# Patient Record
Sex: Female | Born: 1950
Health system: Southern US, Community
[De-identification: ages and names within clinical notes are randomized; demographics above are authoritative.]

## PROBLEM LIST (undated history)

## (undated) DIAGNOSIS — I2510 Atherosclerotic heart disease of native coronary artery without angina pectoris: Secondary | ICD-10-CM

## (undated) DIAGNOSIS — I213 ST elevation (STEMI) myocardial infarction of unspecified site: Secondary | ICD-10-CM

## (undated) DIAGNOSIS — I10 Essential (primary) hypertension: Secondary | ICD-10-CM

## (undated) HISTORY — DX: Atherosclerotic heart disease of native coronary artery without angina pectoris: I25.10

## (undated) HISTORY — PX: CORONARY ARTERY BYPASS GRAFT: SHX141

## (undated) HISTORY — DX: Essential (primary) hypertension: I10

## (undated) HISTORY — DX: ST elevation (STEMI) myocardial infarction of unspecified site: I21.3

## (undated) HISTORY — PX: CARDIAC CATHETERIZATION: SHX172

---

## 2005-01-21 ENCOUNTER — Ambulatory Visit: Payer: Self-pay | Admitting: Orthopedic Surgery

## 2005-01-27 ENCOUNTER — Ambulatory Visit: Payer: Self-pay | Admitting: Orthopedic Surgery

## 2005-02-17 ENCOUNTER — Ambulatory Visit: Payer: Self-pay | Admitting: Orthopedic Surgery

## 2008-10-02 ENCOUNTER — Ambulatory Visit (HOSPITAL_COMMUNITY): Admission: RE | Admit: 2008-10-02 | Discharge: 2008-10-02 | Payer: Self-pay | Admitting: Pulmonary Disease

## 2008-10-16 ENCOUNTER — Ambulatory Visit (HOSPITAL_COMMUNITY): Admission: RE | Admit: 2008-10-16 | Discharge: 2008-10-16 | Payer: Self-pay | Admitting: Pulmonary Disease

## 2008-10-28 ENCOUNTER — Encounter: Payer: Self-pay | Admitting: Gastroenterology

## 2008-10-28 ENCOUNTER — Ambulatory Visit (HOSPITAL_COMMUNITY): Admission: RE | Admit: 2008-10-28 | Discharge: 2008-10-28 | Payer: Self-pay | Admitting: Gastroenterology

## 2008-10-28 ENCOUNTER — Ambulatory Visit: Payer: Self-pay | Admitting: Gastroenterology

## 2010-08-28 ENCOUNTER — Ambulatory Visit (HOSPITAL_COMMUNITY): Admission: RE | Admit: 2010-08-28 | Discharge: 2010-08-28 | Payer: Self-pay | Admitting: Pulmonary Disease

## 2010-09-03 ENCOUNTER — Ambulatory Visit (HOSPITAL_COMMUNITY): Admission: RE | Admit: 2010-09-03 | Discharge: 2010-09-03 | Payer: Self-pay | Admitting: Pulmonary Disease

## 2011-04-20 NOTE — Op Note (Signed)
NAME:  MIOSOTIS, WETSEL              ACCOUNT NO.:  [id number]   MEDICAL RECORD NO.:  [id number]          PATIENT TYPE:  AMB   LOCATION:  DAY                           FACILITY:  [facility]   PHYSICIAN:  Kassie Mends, M.D.      DATE OF BIRTH:  06/26/1951   DATE OF PROCEDURE:  DATE OF DISCHARGE:                               OPERATIVE REPORT   REFERRING PHYSICIAN:  Edward L. Juanetta Gosling, MD   PROCEDURE:  Colonoscopy with snare cautery polypectomy and tattoo of the  base.   INDICATION FOR EXAM:  Ms. Waner is a 60 year old female who presents  for average risk colon cancer screening.   FINDINGS:  1. A 6-mm slightly pedunculated transverse colon polyp removed via      snare cautery.  The base of the lesion was tattooed with 2 mL of      Spot.  2. Frequent sigmoid colon diverticula.  Otherwise, no masses,      inflammatory changes, or AVMs seen.  Normal retroflexed view of the rectum.  1. Melanosis coli, diffuse.   DIAGNOSES:  1. Transverse colon polyp.  2. Mild diverticulosis.   RECOMMENDATIONS:  1. Will call Ms. Padmanabhan with the results of her biopsies.  2. She should follow a high-fiber diet.  She was given a handout on      high-fiber diet, polyps, diverticulosis.  3. No aspirin, NSAIDs, or anticoagulation for 7 days.   MEDICATIONS:  1. Demerol 75 mg IV.  2. Versed 3 mg IV.   PROCEDURE TECHNIQUE:  Physical exam was performed.  Informed consent was  obtained from the patient after explaining benefits, risks, and  alternatives to the procedure.  The patient was connected to the  monitor, placed in left lateral position.  Continuous oxygen was  provided by nasal cannula, IV medicine administered through an  indwelling cannula.  After administration of sedation and rectal exam,  the patient's rectum was intubated and the scope was advanced under  direct visualization to the cecum.  The scope was removed slowly by  carefully examining  the color, texture, anatomy, and integrity of  the mucosa on the way out.  The patient was recovered in endoscopy and discharged home in  satisfactory condition.   PATH:  Inflammatory polyp. TCS in 10 years.      Kassie Mends, M.D.  Electronically Signed     SM/MEDQ  D:  10/28/2008  T:  10/28/2008  Job:  [id number]   cc:   Ramon Dredge L. Juanetta Gosling, M.D.  Fax: [phone]

## 2011-04-20 NOTE — Procedures (Signed)
Paige [name], Paige [name]              ACCOUNT NO.:  [id number]   MEDICAL RECORD NO.:  [id number]          PATIENT TYPE:  OUT   LOCATION:  RESP                          FACILITY:  [facility]   PHYSICIAN:  Edward L. Juanetta Gosling, M.D.DATE OF BIRTH:  [birth year]   DATE OF PROCEDURE:  DATE OF DISCHARGE:  10/16/2008                            PULMONARY FUNCTION TEST   PULMONARY FUNCTION TEST:  1. Spirometry shows no ventilatory defect and evidence of mild airflow      obstruction at the level of the smaller airways.  2. Lung volumes are normal.  3. DLCO is mildly reduced.  4. Arterial blood gases are normal.      Edward L. Juanetta Gosling, M.D.  Electronically Signed     ELH/MEDQ  D:  10/18/2008  T:  10/18/2008  Job:  [id number]

## 2011-09-07 LAB — BLOOD GAS, ARTERIAL
Bicarbonate: 24.5 — ABNORMAL HIGH
FIO2: 21
O2 Saturation: 97.1
TCO2: 21.6

## 2015-03-21 NOTE — Progress Notes (Signed)
Patient ID: Paige [name], female   DOB: [birth year], 64 y.o.   MRN: [id number]     Cardiology Office Note   Date:  03/21/2015   ID:  Paige [name], DOB Aug 02, 1951, MRN [id number]  PCP:  No primary care provider on file.  Cardiologist:   Jenkins Rouge, MD   No chief complaint on file.     History of Present Illness: Paige [name] is a 64 y.o. female who presents for evaluation of CAD.  Had STEMI in [place] over 50 pages of records.    BMS to culprit RCA MI 01/23/15  Followed by CABG with LIMA to LAD, SVG RCA and SVG OM EF normal no valve disease  D/C with no DAT   And high dose lipitor   No preceding angina dyspnea  On day of MI did have nausea and diaphoresis with chest pain but never a problem before that day  Compliant with meds LLE venectomy sight and sternum healing well      Past Medical History  Diagnosis Date  . STEMI (ST elevation myocardial infarction)   . Coronary artery disease   . Hypertension     Past Surgical History  Procedure Laterality Date  . Cardiac catheterization    . Coronary artery bypass graft       Current Outpatient Prescriptions  Medication Sig Dispense Refill  . aspirin 81 MG tablet Take 81 mg by mouth daily.    Marland Kitchen atorvastatin (LIPITOR) 80 MG tablet Take 80 mg by mouth daily.    . Cholecalciferol (VITAMIN D3) 5000 UNITS CAPS Take 5,000 Int'l Units by mouth daily.    . famotidine (PEPCID) 20 MG tablet Take 20 mg by mouth 2 (two) times daily.    . furosemide (LASIX) 40 MG tablet Take 40 mg by mouth daily.    Marland Kitchen L-Methylfolate-B12-B6-B2 (ENFOLAST PO) Take by mouth 2 (two) times daily.    Marland Kitchen levothyroxine (SYNTHROID, LEVOTHROID) 75 MCG tablet Take 75 mcg by mouth daily before breakfast.    . metoprolol tartrate (LOPRESSOR) 25 MG tablet Take 25 mg by mouth 2 (two) times daily.    Marland Kitchen oxyCODONE-acetaminophen (PERCOCET) 10-325 MG per tablet Take 1 tablet by mouth every 4 (four) hours as needed for pain.    . traMADol (ULTRAM) 50 MG  tablet Take 50 mg by mouth every 6 (six) hours as needed.     No current facility-administered medications for this visit.    Allergies:   Review of patient's allergies indicates not on file.    Social History:  The patient     Family History:  The patient's family history includes Heart disease in her father.    ROS:  Please see the history of present illness.   Otherwise, review of systems are positive for none.   All other systems are reviewed and negative.    PHYSICAL EXAM: VS:  There were no vitals taken for this visit. , BMI There is no height or weight on file to calculate BMI. GEN: Well nourished, well developed, in no acute distress HEENT: normal Neck: no JVD, carotid bruits, or masses Cardiac:  RRR; no murmurs, rubs, or gallops,no edema  Respiratory:  clear to auscultation bilaterally, normal work of breathing GI: soft, nontender, nondistended, + BS MS: no deformity or atrophy Skin: warm and dry, no rash Neuro:  Strength and sensation are intact Psych: euthymic mood, full affect   EKG:  SR rate 50  Low voltage otherwise normal    Recent  Labs: No results found for requested labs within last 365 days.    Lipid Panel No results found for: CHOL, TRIG, HDL, CHOLHDL, VLDL, LDLCALC, LDLDIRECT    Wt Readings from Last 3 Encounters:  No data found for Wt      Other studies Reviewed: Additional studies/ records that were reviewed today include: [facility] hospital CATH and CABG reports Echo and d/c summary see HPI.    ASSESSMENT AND PLAN:  1.  CAD:  01/2015 with IMI preserved EF  Rx BMS and staged CABG.  Add plavix for 4 months with ASA  Continue other meds 2. Chol  lipitor 40 mg f/u labs Dr Hilma Favors  3. Thyroid:  Dose recently lowered f/u TSH with primary    Current medicines are reviewed at length with the patient today.  The patient does not have concerns regarding medicines.  The following changes have been made:  Lipitor 40 mg and plavix 75 mg  Labs/  tests ordered today include:  None   No orders of the defined types were placed in this encounter.     Disposition:   FU with me in 6 momths       Signed, Jenkins Rouge, MD  03/21/2015 12:19 PM    [facility] [address], [place], [place]  [address] Phone: [phone]; Fax: [phone]

## 2015-03-24 ENCOUNTER — Encounter: Payer: Self-pay | Admitting: Cardiovascular Disease

## 2015-03-24 ENCOUNTER — Ambulatory Visit (INDEPENDENT_AMBULATORY_CARE_PROVIDER_SITE_OTHER): Payer: BLUE CROSS/BLUE SHIELD | Admitting: Cardiovascular Disease

## 2015-03-24 VITALS — BP 130/78 | HR 50 | Ht 64.0 in | Wt 178.8 lb

## 2015-03-24 DIAGNOSIS — I2510 Atherosclerotic heart disease of native coronary artery without angina pectoris: Secondary | ICD-10-CM

## 2015-03-24 MED ORDER — ATORVASTATIN CALCIUM 40 MG PO TABS: 40.0000 mg | ORAL_TABLET | Freq: Every day | ORAL | Status: DC

## 2015-03-24 MED ORDER — CLOPIDOGREL BISULFATE 75 MG PO TABS: 75.0000 mg | ORAL_TABLET | Freq: Every day | ORAL | Status: DC

## 2015-03-24 NOTE — Patient Instructions (Signed)
Medication Instructions:   STOP  CRESTOR START  ATORVASTATIN   40  MG    Labwork:NONE   Testing/Procedures:NONE   Follow-Up: Your physician wants you to follow-up in:   [place] will receive a reminder letter in the mail two months in advance. If you don't receive a letter, please call our office to schedule the follow-up appointment.   Any Other Special Instructions Will Be Listed Below (If Applicable).

## 2015-05-20 ENCOUNTER — Other Ambulatory Visit: Payer: Self-pay

## 2015-05-20 MED ORDER — METOPROLOL TARTRATE 25 MG PO TABS: 25.0000 mg | ORAL_TABLET | Freq: Two times a day (BID) | ORAL | Status: DC

## 2015-05-21 DIAGNOSIS — E039 Hypothyroidism, unspecified: Secondary | ICD-10-CM | POA: Diagnosis not present

## 2015-05-21 DIAGNOSIS — R5381 Other malaise: Secondary | ICD-10-CM | POA: Diagnosis not present

## 2015-05-21 DIAGNOSIS — I10 Essential (primary) hypertension: Secondary | ICD-10-CM | POA: Diagnosis not present

## 2015-06-27 DIAGNOSIS — N342 Other urethritis: Secondary | ICD-10-CM | POA: Diagnosis not present

## 2015-06-27 DIAGNOSIS — Z1389 Encounter for screening for other disorder: Secondary | ICD-10-CM | POA: Diagnosis not present

## 2015-06-27 DIAGNOSIS — E782 Mixed hyperlipidemia: Secondary | ICD-10-CM | POA: Diagnosis not present

## 2015-06-27 DIAGNOSIS — Z6830 Body mass index (BMI) 30.0-30.9, adult: Secondary | ICD-10-CM | POA: Diagnosis not present

## 2015-06-27 DIAGNOSIS — I2510 Atherosclerotic heart disease of native coronary artery without angina pectoris: Secondary | ICD-10-CM | POA: Diagnosis not present

## 2015-06-27 DIAGNOSIS — E6609 Other obesity due to excess calories: Secondary | ICD-10-CM | POA: Diagnosis not present

## 2015-06-27 DIAGNOSIS — E039 Hypothyroidism, unspecified: Secondary | ICD-10-CM | POA: Diagnosis not present

## 2015-07-14 ENCOUNTER — Other Ambulatory Visit: Payer: Self-pay | Admitting: *Deleted

## 2015-07-14 MED ORDER — ATORVASTATIN CALCIUM 40 MG PO TABS: 40.0000 mg | ORAL_TABLET | Freq: Every day | ORAL | Status: DC

## 2015-07-14 MED ORDER — CLOPIDOGREL BISULFATE 75 MG PO TABS: 75.0000 mg | ORAL_TABLET | Freq: Every day | ORAL | Status: DC

## 2015-07-14 MED ORDER — METOPROLOL TARTRATE 25 MG PO TABS: 25.0000 mg | ORAL_TABLET | Freq: Two times a day (BID) | ORAL | Status: DC

## 2015-07-16 DIAGNOSIS — Z6830 Body mass index (BMI) 30.0-30.9, adult: Secondary | ICD-10-CM | POA: Diagnosis not present

## 2015-07-16 DIAGNOSIS — N342 Other urethritis: Secondary | ICD-10-CM | POA: Diagnosis not present

## 2015-07-16 DIAGNOSIS — R3919 Other difficulties with micturition: Secondary | ICD-10-CM | POA: Diagnosis not present

## 2015-07-16 DIAGNOSIS — Z1389 Encounter for screening for other disorder: Secondary | ICD-10-CM | POA: Diagnosis not present

## 2015-07-16 DIAGNOSIS — E6609 Other obesity due to excess calories: Secondary | ICD-10-CM | POA: Diagnosis not present

## 2015-07-21 DIAGNOSIS — E785 Hyperlipidemia, unspecified: Secondary | ICD-10-CM | POA: Diagnosis not present

## 2015-07-21 DIAGNOSIS — E039 Hypothyroidism, unspecified: Secondary | ICD-10-CM | POA: Diagnosis not present

## 2015-07-21 DIAGNOSIS — I10 Essential (primary) hypertension: Secondary | ICD-10-CM | POA: Diagnosis not present

## 2015-07-21 DIAGNOSIS — I2510 Atherosclerotic heart disease of native coronary artery without angina pectoris: Secondary | ICD-10-CM | POA: Diagnosis not present

## 2015-07-31 DIAGNOSIS — N390 Urinary tract infection, site not specified: Secondary | ICD-10-CM | POA: Diagnosis not present

## 2015-08-14 ENCOUNTER — Other Ambulatory Visit: Payer: Self-pay | Admitting: Cardiovascular Disease

## 2015-08-15 ENCOUNTER — Ambulatory Visit (INDEPENDENT_AMBULATORY_CARE_PROVIDER_SITE_OTHER): Payer: Medicare Other | Admitting: Urology

## 2015-08-15 DIAGNOSIS — Z87440 Personal history of urinary (tract) infections: Secondary | ICD-10-CM | POA: Diagnosis not present

## 2015-08-15 DIAGNOSIS — N952 Postmenopausal atrophic vaginitis: Secondary | ICD-10-CM | POA: Diagnosis not present

## 2015-08-15 DIAGNOSIS — N390 Urinary tract infection, site not specified: Secondary | ICD-10-CM | POA: Diagnosis not present

## 2015-08-15 DIAGNOSIS — N8111 Cystocele, midline: Secondary | ICD-10-CM | POA: Diagnosis not present

## 2015-09-22 DIAGNOSIS — Z23 Encounter for immunization: Secondary | ICD-10-CM | POA: Diagnosis not present

## 2015-09-22 NOTE — Progress Notes (Signed)
Patient ID: Paige [name], female   DOB: 1951-05-28, 64 y.o.   MRN: [id number]     Cardiology Office Note   Date:  09/24/2015   ID:  Paige [name], DOB 12-May-1951, MRN [id number]  PCP:  Purvis Kilts, MD  Cardiologist:   Jenkins Rouge, MD   No chief complaint on file.     History of Present Illness: Paige [name] is a 64 y.o. female who presents for evaluation of CAD.  Had STEMI in [place] over 50 pages of records.    BMS to culprit RCA MI 01/23/15  Followed by CABG with LIMA to LAD, SVG RCA and SVG OM EF normal no valve disease  D/C with no DAT   And high dose lipitor   No preceding angina dyspnea  On day of MI did have nausea and diaphoresis with chest pain but never a problem before that day  Compliant with meds LLE venectomy sight and sternum healing well  Mild tingling at sternum and pruritis  Going to [facility] for winter until April    Past Medical History  Diagnosis Date  . STEMI (ST elevation myocardial infarction) ([place])   . Coronary artery disease   . Hypertension     Past Surgical History  Procedure Laterality Date  . Cardiac catheterization    . Coronary artery bypass graft       Current Outpatient Prescriptions  Medication Sig Dispense Refill  . aspirin 81 MG tablet Take 81 mg by mouth daily.    Marland Kitchen atorvastatin (LIPITOR) 40 MG tablet TAKE 1 TABLET EVERY DAY 90 tablet 2  . cetirizine (ZYRTEC) 10 MG tablet Take 10 mg by mouth daily.    . Cholecalciferol (VITAMIN D3) 5000 UNITS CAPS Take 5,000 Int'l Units by mouth daily.    . clopidogrel (PLAVIX) 75 MG tablet TAKE 1 TABLET EVERY DAY 90 tablet 2  . levothyroxine (SYNTHROID, LEVOTHROID) 75 MCG tablet Take 75 mcg by mouth daily before breakfast.    . meloxicam (MOBIC) 15 MG tablet Take 15 mg by mouth daily as needed for pain.     . metoprolol tartrate (LOPRESSOR) 25 MG tablet TAKE 1 TABLET TWICE DAILY 180 tablet 2  . Multiple Vitamin (MULTIVITAMIN) tablet Take 1 tablet by  mouth daily.     No current facility-administered medications for this visit.    Allergies:   Review of patient's allergies indicates no known allergies.    Social History:  The patient  reports that she has never smoked. She does not have any smokeless tobacco history on file. She reports that she does not drink alcohol or use illicit drugs.   Family History:  The patient's family history includes Breast cancer in her mother; Heart disease in her father.    ROS:  Please see the history of present illness.   Otherwise, review of systems are positive for none.   All other systems are reviewed and negative.    PHYSICAL EXAM: VS:  BP 118/78 mmHg  Pulse 56  Ht 5\' 4"  (1.626 m)  Wt 81.738 kg (180 lb 3.2 oz)  BMI 30.92 kg/m2  SpO2 98% , BMI Body mass index is 30.92 kg/(m^2). GEN: Well nourished, well developed, in no acute distress HEENT: normal Neck: no JVD, carotid bruits, or masses Cardiac:  RRR; no murmurs, rubs, or gallops,no edema  Respiratory:  clear to auscultation bilaterally, normal work of breathing GI: soft, nontender, nondistended, + BS MS: no deformity or atrophy Skin: warm and  dry, no rash Neuro:  Strength and sensation are intact Psych: euthymic mood, full affect   EKG:  03/24/15  SR rate 50  Low voltage otherwise normal    Recent Labs: No results found for requested labs within last 365 days.    Lipid Panel No results found for: CHOL, TRIG, HDL, CHOLHDL, VLDL, LDLCALC, LDLDIRECT    Wt Readings from Last 3 Encounters:  09/24/15 81.738 kg (180 lb 3.2 oz)  03/24/15 81.103 kg (178 lb 12.8 oz)      Other studies Reviewed: Additional studies/ records that were reviewed today include: [facility] hospital CATH and CABG reports Echo and d/c summary see HPI.    ASSESSMENT AND PLAN:  1.  CAD:  01/2015 with IMI preserved EF  Rx BMS and staged CABG.  Finished asa/plavix now 6 months  2. Chol  lipitor 40 mg f/u labs Dr Hilma Favors  3. Thyroid:  Dose recently lowered  f/u TSH with primary    Current medicines are reviewed at length with the patient today.  The patient does not have concerns regarding medicines.  The following changes have been made:  Stop plavix   Labs/ tests ordered today include:  None   No orders of the defined types were placed in this encounter.     Disposition:   FU with me in 6 momths       Signed, Jenkins Rouge, MD  09/24/2015 8:09 AM    [facility] [address], [place], [place]  [address] Phone: [phone]; Fax: [phone]

## 2015-09-24 ENCOUNTER — Ambulatory Visit (INDEPENDENT_AMBULATORY_CARE_PROVIDER_SITE_OTHER): Payer: Medicare Other | Admitting: Cardiovascular Disease

## 2015-09-24 ENCOUNTER — Encounter: Payer: Self-pay | Admitting: Cardiovascular Disease

## 2015-09-24 VITALS — BP 118/78 | HR 56 | Ht 64.0 in | Wt 180.2 lb

## 2015-09-24 DIAGNOSIS — I2510 Atherosclerotic heart disease of native coronary artery without angina pectoris: Secondary | ICD-10-CM

## 2015-09-24 DIAGNOSIS — I2583 Coronary atherosclerosis due to lipid rich plaque: Principal | ICD-10-CM

## 2015-09-24 MED ORDER — NITROGLYCERIN 0.4 MG SL SUBL: 0.4000 mg | SUBLINGUAL_TABLET | SUBLINGUAL | Status: DC | PRN

## 2015-09-24 NOTE — Addendum Note (Signed)
Addended by: Devra Dopp E on: 09/24/2015 08:13 AM   Modules accepted: Orders

## 2015-09-24 NOTE — Patient Instructions (Signed)
Medication Instructions:  Your physician recommends that you continue on your current medications as directed. Please refer to the Current Medication list given to you today.   Labwork: NONE  Testing/Procedures: NONE  Follow-Up Your physician wants you to follow-up in: DUE April [place] Cathren Laine will receive a reminder letter in the mail two months in advance. If you don't receive a letter, please call our office to schedule the follow-up appointment.     Any Other Special Instructions Will Be Listed Below (If Applicable).

## 2015-10-03 ENCOUNTER — Encounter: Payer: Self-pay | Admitting: Cardiovascular Disease

## 2015-10-09 DIAGNOSIS — Z6831 Body mass index (BMI) 31.0-31.9, adult: Secondary | ICD-10-CM | POA: Diagnosis not present

## 2015-10-09 DIAGNOSIS — Z01419 Encounter for gynecological examination (general) (routine) without abnormal findings: Secondary | ICD-10-CM | POA: Diagnosis not present

## 2015-10-09 DIAGNOSIS — Z1239 Encounter for other screening for malignant neoplasm of breast: Secondary | ICD-10-CM | POA: Diagnosis not present

## 2015-10-15 DIAGNOSIS — L570 Actinic keratosis: Secondary | ICD-10-CM | POA: Diagnosis not present

## 2015-10-15 DIAGNOSIS — D239 Other benign neoplasm of skin, unspecified: Secondary | ICD-10-CM | POA: Diagnosis not present

## 2015-10-15 DIAGNOSIS — L910 Hypertrophic scar: Secondary | ICD-10-CM | POA: Diagnosis not present

## 2015-10-20 DIAGNOSIS — Z029 Encounter for administrative examinations, unspecified: Secondary | ICD-10-CM | POA: Diagnosis not present

## 2015-11-03 DIAGNOSIS — Z1231 Encounter for screening mammogram for malignant neoplasm of breast: Secondary | ICD-10-CM | POA: Diagnosis not present

## 2016-03-11 ENCOUNTER — Encounter: Payer: Self-pay | Admitting: Cardiovascular Disease

## 2016-03-11 DIAGNOSIS — Z6830 Body mass index (BMI) 30.0-30.9, adult: Secondary | ICD-10-CM | POA: Diagnosis not present

## 2016-03-11 DIAGNOSIS — E782 Mixed hyperlipidemia: Secondary | ICD-10-CM | POA: Diagnosis not present

## 2016-03-11 DIAGNOSIS — E039 Hypothyroidism, unspecified: Secondary | ICD-10-CM | POA: Diagnosis not present

## 2016-03-11 DIAGNOSIS — Z1389 Encounter for screening for other disorder: Secondary | ICD-10-CM | POA: Diagnosis not present

## 2016-03-11 DIAGNOSIS — Z23 Encounter for immunization: Secondary | ICD-10-CM | POA: Diagnosis not present

## 2016-03-31 ENCOUNTER — Other Ambulatory Visit: Payer: Self-pay | Admitting: Cardiovascular Disease

## 2016-04-28 NOTE — Progress Notes (Signed)
Patient ID: Paige [name], female   DOB: February 02, 1951, 65 y.o.   MRN: [id number]     Cardiology Office Note   Date:  04/29/2016   ID:  Paige [name], DOB 04/12/1951, MRN [id number]  PCP:  Purvis Kilts, MD  Cardiologist:   Jenkins Rouge, MD   Chief Complaint  Patient presents with  . Coronary Artery Disease      History of Present Illness: Paige [name] is a 65 y.o. female who presents for evaluation of CAD.  Had STEMI in [place] over 50 pages of records.    BMS to culprit RCA MI 01/23/15  Followed by CABG with LIMA to LAD, SVG RCA and SVG OM EF normal no valve disease  D/C with no DAT   And high dose lipitor   No preceding angina dyspnea  On day of MI did have nausea and diaphoresis with chest pain but never a problem before that day  Compliant with meds  Spend winter in [facility]  Usually till April     Past Medical History  Diagnosis Date  . STEMI (ST elevation myocardial infarction) ([place])   . Coronary artery disease   . Hypertension     Past Surgical History  Procedure Laterality Date  . Cardiac catheterization    . Coronary artery bypass graft       Current Outpatient Prescriptions  Medication Sig Dispense Refill  . aspirin 81 MG tablet Take 81 mg by mouth daily.    Marland Kitchen atorvastatin (LIPITOR) 40 MG tablet Take 40 mg by mouth daily.    . cetirizine (ZYRTEC) 10 MG tablet Take 10 mg by mouth daily.    . Cholecalciferol (VITAMIN D3) 5000 UNITS CAPS Take 5,000 Int'l Units by mouth daily.    Marland Kitchen levothyroxine (SYNTHROID, LEVOTHROID) 75 MCG tablet Take 75 mcg by mouth daily before breakfast.    . meloxicam (MOBIC) 15 MG tablet Take 15 mg by mouth daily as needed for pain.     . metoprolol tartrate (LOPRESSOR) 25 MG tablet Take 25 mg by mouth 2 (two) times daily.    . Multiple Vitamin (MULTIVITAMIN) tablet Take 1 tablet by mouth daily.    . nitroGLYCERIN (NITROSTAT) 0.4 MG SL tablet Place 1 tablet (0.4 mg total) under the tongue every 5  (five) minutes as needed. 25 tablet 3   No current facility-administered medications for this visit.    Allergies:   Review of patient's allergies indicates no known allergies.    Social History:  The patient  reports that she has never smoked. She does not have any smokeless tobacco history on file. She reports that she does not drink alcohol or use illicit drugs.   Family History:  The patient's family history includes Breast cancer in her mother; Heart disease in her father.    ROS:  Please see the history of present illness.   Otherwise, review of systems are positive for none.   All other systems are reviewed and negative.    PHYSICAL EXAM: VS:  BP 140/90 mmHg  Pulse 53  Ht 5\' 4"  (1.626 m)  Wt 81.557 kg (179 lb 12.8 oz)  BMI 30.85 kg/m2  SpO2 98% , BMI Body mass index is 30.85 kg/(m^2). GEN: Well nourished, well developed, in no acute distress HEENT: normal Neck: no JVD, carotid bruits, or masses Cardiac:  RRR; no murmurs, rubs, or gallops,no edema  Respiratory:  clear to auscultation bilaterally, normal work of breathing GI: soft, nontender, nondistended, +  BS MS: no deformity or atrophy Skin: warm and dry, no rash Neuro:  Strength and sensation are intact Psych: euthymic mood, full affect   EKG:  03/24/15  SR rate 50  Low voltage otherwise normal  04/29/16  SR rate 53 low voltage    Recent Labs: No results found for requested labs within last 365 days.    Lipid Panel No results found for: CHOL, TRIG, HDL, CHOLHDL, VLDL, LDLCALC, LDLDIRECT    Wt Readings from Last 3 Encounters:  04/29/16 81.557 kg (179 lb 12.8 oz)  09/24/15 81.738 kg (180 lb 3.2 oz)  03/24/15 81.103 kg (178 lb 12.8 oz)      Other studies Reviewed: Additional studies/ records that were reviewed today include: [facility] hospital CATH and CABG reports Echo and d/c summary see HPI.    ASSESSMENT AND PLAN:  1.  CAD:  01/2015 with IMI preserved EF  Rx BMS and staged CABG.   Continue ASA and  beta blocker Off plavix now with BMS  2. Chol  lipitor 40 mg f/u labs Dr Hilma Favors  3. Thyroid:  Dose recently lowered f/u TSH with primary    Current medicines are reviewed at length with the patient today.  The patient does not have concerns regarding medicines.  The following changes have been made:     Labs/ tests ordered today include:  None   No orders of the defined types were placed in this encounter.     Disposition:   FU with me in a year      Signed, Jenkins Rouge, MD  04/29/2016 9:14 AM    [facility] [address], [place], [place]  [address] Phone: [phone]; Fax: [phone]

## 2016-04-29 ENCOUNTER — Encounter: Payer: Self-pay | Admitting: Cardiovascular Disease

## 2016-04-29 ENCOUNTER — Ambulatory Visit (INDEPENDENT_AMBULATORY_CARE_PROVIDER_SITE_OTHER): Payer: Medicare Other | Admitting: Cardiovascular Disease

## 2016-04-29 VITALS — BP 140/90 | HR 53 | Ht 64.0 in | Wt 179.8 lb

## 2016-04-29 DIAGNOSIS — I2510 Atherosclerotic heart disease of native coronary artery without angina pectoris: Secondary | ICD-10-CM

## 2016-04-29 DIAGNOSIS — I2583 Coronary atherosclerosis due to lipid rich plaque: Principal | ICD-10-CM

## 2016-04-29 MED ORDER — METOPROLOL TARTRATE 25 MG PO TABS: 25.0000 mg | ORAL_TABLET | Freq: Two times a day (BID) | ORAL | Status: DC

## 2016-04-29 MED ORDER — ATORVASTATIN CALCIUM 40 MG PO TABS
40.0000 mg | ORAL_TABLET | Freq: Every day | ORAL | Status: DC
Start: 2016-04-29 — End: 2016-08-02

## 2016-04-29 NOTE — Patient Instructions (Signed)

## 2016-05-05 DIAGNOSIS — E039 Hypothyroidism, unspecified: Secondary | ICD-10-CM | POA: Diagnosis not present

## 2016-05-05 DIAGNOSIS — I2510 Atherosclerotic heart disease of native coronary artery without angina pectoris: Secondary | ICD-10-CM | POA: Diagnosis not present

## 2016-05-05 DIAGNOSIS — R7309 Other abnormal glucose: Secondary | ICD-10-CM | POA: Diagnosis not present

## 2016-05-05 DIAGNOSIS — E782 Mixed hyperlipidemia: Secondary | ICD-10-CM | POA: Diagnosis not present

## 2016-05-05 DIAGNOSIS — Z6831 Body mass index (BMI) 31.0-31.9, adult: Secondary | ICD-10-CM | POA: Diagnosis not present

## 2016-05-05 DIAGNOSIS — Z0000 Encounter for general adult medical examination without abnormal findings: Secondary | ICD-10-CM | POA: Diagnosis not present

## 2016-05-28 ENCOUNTER — Other Ambulatory Visit: Payer: Self-pay | Admitting: Family Medicine

## 2016-05-28 DIAGNOSIS — R5381 Other malaise: Secondary | ICD-10-CM

## 2016-05-31 ENCOUNTER — Other Ambulatory Visit: Payer: Self-pay | Admitting: Family Medicine

## 2016-05-31 DIAGNOSIS — E2839 Other primary ovarian failure: Secondary | ICD-10-CM

## 2016-06-01 ENCOUNTER — Other Ambulatory Visit (HOSPITAL_COMMUNITY): Payer: Self-pay | Admitting: Family Medicine

## 2016-06-01 DIAGNOSIS — Z0000 Encounter for general adult medical examination without abnormal findings: Secondary | ICD-10-CM

## 2016-06-02 ENCOUNTER — Ambulatory Visit (HOSPITAL_COMMUNITY)
Admission: RE | Admit: 2016-06-02 | Discharge: 2016-06-02 | Disposition: A | Discharge status: Home / Self Care | Payer: Medicare Other | Source: Ambulatory Visit | Attending: Family Medicine | Admitting: Family Medicine

## 2016-06-02 DIAGNOSIS — Z0000 Encounter for general adult medical examination without abnormal findings: Secondary | ICD-10-CM | POA: Diagnosis present

## 2016-06-02 DIAGNOSIS — M8588 Other specified disorders of bone density and structure, other site: Secondary | ICD-10-CM | POA: Diagnosis not present

## 2016-06-02 DIAGNOSIS — M8589 Other specified disorders of bone density and structure, multiple sites: Secondary | ICD-10-CM | POA: Diagnosis not present

## 2016-06-02 DIAGNOSIS — E782 Mixed hyperlipidemia: Secondary | ICD-10-CM | POA: Insufficient documentation

## 2016-06-02 DIAGNOSIS — E2839 Other primary ovarian failure: Secondary | ICD-10-CM | POA: Insufficient documentation

## 2016-07-22 DIAGNOSIS — H11153 Pinguecula, bilateral: Secondary | ICD-10-CM | POA: Diagnosis not present

## 2016-07-22 DIAGNOSIS — Z9889 Other specified postprocedural states: Secondary | ICD-10-CM | POA: Diagnosis not present

## 2016-07-22 DIAGNOSIS — H52223 Regular astigmatism, bilateral: Secondary | ICD-10-CM | POA: Diagnosis not present

## 2016-07-26 ENCOUNTER — Telehealth: Payer: Self-pay | Admitting: Cardiovascular Disease

## 2016-07-26 DIAGNOSIS — Z79899 Other long term (current) drug therapy: Secondary | ICD-10-CM

## 2016-07-26 NOTE — Telephone Encounter (Signed)
New message     Patient calling   Pt C/O medication issue:  1. Name of Medication: atorvastatin    2. How are you currently taking this medication (dosage and times per day)? Was 40 mg . humana 20 mg by Dr. Armandina Gemma from [facility].    3. Are you having a reaction (difficulty breathing--STAT)? No   4. What is your medication issue? Need clarification.

## 2016-07-26 NOTE — Telephone Encounter (Signed)
Spoke with patient who called to ask if Dr. Johnsie Cancel wants her to take atorvastatin 40 mg or 20 mg.  She states Dr. Hilma Favors changed her Rx from 40 mg to 20 mg.  I asked if she has results of lipid panel and she reports total cholesterol 156, HDL 51, LDL 87 and triglycerides 89.  I advised that with hx of CAD, Dr. Johnsie Cancel would likely want her LDL to be closer to 70 mg/dl and asked her to have copies faxed to [place] at [phone].  I advised I will forward message to Dr. Johnsie Cancel for review.  She asks that if he is in agreement that she take 40 mg, that a new Rx be sent for #90 to [facility]. She thanked me for the call.

## 2016-07-27 NOTE — Telephone Encounter (Signed)
Called patient back. Informed patient that Dr. Johnsie Cancel wants her to be on Lipitor 40 mg and to come in for lab work in 6 months. Patient stated she was not going to be back until April. Informed patient that lab work could be done then. Patient will come to the lab on 03/07/17.l Patient verbalized understanding.

## 2016-07-27 NOTE — Telephone Encounter (Signed)
40 mg better and f/u labs in 6 months

## 2016-08-02 ENCOUNTER — Telehealth: Payer: Self-pay | Admitting: Cardiovascular Disease

## 2016-08-02 MED ORDER — ATORVASTATIN CALCIUM 40 MG PO TABS: 40.0000 mg | ORAL_TABLET | Freq: Every day | ORAL | 3 refills | Status: DC

## 2016-08-02 NOTE — Telephone Encounter (Signed)
New message       Pt is calling to confirm that the nurse called in her prescription to [facility]. Please call.

## 2016-08-02 NOTE — Telephone Encounter (Signed)
Sent in refill for Lipitor 40 mg to [facility].

## 2016-09-27 DIAGNOSIS — Z23 Encounter for immunization: Secondary | ICD-10-CM | POA: Diagnosis not present

## 2016-10-25 DIAGNOSIS — E669 Obesity, unspecified: Secondary | ICD-10-CM | POA: Diagnosis not present

## 2016-10-25 DIAGNOSIS — Z6831 Body mass index (BMI) 31.0-31.9, adult: Secondary | ICD-10-CM | POA: Diagnosis not present

## 2016-10-25 DIAGNOSIS — J0181 Other acute recurrent sinusitis: Secondary | ICD-10-CM | POA: Diagnosis not present

## 2016-10-25 DIAGNOSIS — E782 Mixed hyperlipidemia: Secondary | ICD-10-CM | POA: Diagnosis not present

## 2016-10-25 DIAGNOSIS — Z1389 Encounter for screening for other disorder: Secondary | ICD-10-CM | POA: Diagnosis not present

## 2016-10-25 DIAGNOSIS — E6609 Other obesity due to excess calories: Secondary | ICD-10-CM | POA: Diagnosis not present

## 2016-10-25 DIAGNOSIS — E748 Other specified disorders of carbohydrate metabolism: Secondary | ICD-10-CM | POA: Diagnosis not present

## 2016-10-25 DIAGNOSIS — E063 Autoimmune thyroiditis: Secondary | ICD-10-CM | POA: Diagnosis not present

## 2016-11-04 DIAGNOSIS — Z1231 Encounter for screening mammogram for malignant neoplasm of breast: Secondary | ICD-10-CM | POA: Diagnosis not present

## 2016-11-08 DIAGNOSIS — E782 Mixed hyperlipidemia: Secondary | ICD-10-CM | POA: Diagnosis not present

## 2016-11-08 DIAGNOSIS — E063 Autoimmune thyroiditis: Secondary | ICD-10-CM | POA: Diagnosis not present

## 2016-11-08 DIAGNOSIS — Z1389 Encounter for screening for other disorder: Secondary | ICD-10-CM | POA: Diagnosis not present

## 2016-11-08 DIAGNOSIS — R7309 Other abnormal glucose: Secondary | ICD-10-CM | POA: Diagnosis not present

## 2016-11-08 DIAGNOSIS — N390 Urinary tract infection, site not specified: Secondary | ICD-10-CM | POA: Diagnosis not present

## 2016-11-08 DIAGNOSIS — Z6830 Body mass index (BMI) 30.0-30.9, adult: Secondary | ICD-10-CM | POA: Diagnosis not present

## 2016-11-08 DIAGNOSIS — I2510 Atherosclerotic heart disease of native coronary artery without angina pectoris: Secondary | ICD-10-CM | POA: Diagnosis not present

## 2016-11-08 DIAGNOSIS — E039 Hypothyroidism, unspecified: Secondary | ICD-10-CM | POA: Diagnosis not present

## 2017-03-07 ENCOUNTER — Other Ambulatory Visit: Payer: Medicare Other

## 2017-03-11 ENCOUNTER — Other Ambulatory Visit: Payer: Medicare Other | Admitting: *Deleted

## 2017-03-11 DIAGNOSIS — Z79899 Other long term (current) drug therapy: Secondary | ICD-10-CM

## 2017-03-11 LAB — HEPATIC FUNCTION PANEL
ALBUMIN: 4.5 g/dL (ref 3.6–4.8)
ALK PHOS: 93 IU/L (ref 39–117)
ALT: 33 IU/L — ABNORMAL HIGH (ref 0–32)
AST: 35 IU/L (ref 0–40)
Bilirubin Total: 0.6 mg/dL (ref 0.0–1.2)
Bilirubin, Direct: 0.17 mg/dL (ref 0.00–0.40)
TOTAL PROTEIN: 6.8 g/dL (ref 6.0–8.5)

## 2017-03-11 LAB — LIPID PANEL
CHOLESTEROL TOTAL: 158 mg/dL (ref 100–199)
Chol/HDL Ratio: 2.9 ratio (ref 0.0–4.4)
HDL: 54 mg/dL (ref >39)
LDL CALC: 91 mg/dL (ref 0–99)
Triglycerides: 64 mg/dL (ref 0–149)
VLDL Cholesterol Cal: 13 mg/dL (ref 5–40)

## 2017-03-11 NOTE — Addendum Note (Signed)
Addended by: Eulis Foster on: 03/11/2017 08:33 AM   Modules accepted: Orders

## 2017-03-15 ENCOUNTER — Telehealth: Payer: Self-pay

## 2017-03-15 DIAGNOSIS — Z79899 Other long term (current) drug therapy: Secondary | ICD-10-CM

## 2017-03-15 NOTE — Telephone Encounter (Signed)
Patient aware of lab results. Patient will go to [facility] in October for repeat lab work.

## 2017-03-15 NOTE — Telephone Encounter (Signed)
-----   Message from Josue Hector, MD sent at 03/11/2017  3:58 PM EDT ----- Cholesterol is at goal and LFT's are normal F/U labs in 6 months

## 2017-04-14 ENCOUNTER — Encounter: Payer: Self-pay | Admitting: Cardiovascular Disease

## 2017-05-04 NOTE — Progress Notes (Signed)
Patient ID: Paige [name], female   DOB: Mar 04, 1951, 66 y.o.   MRN: [id number]     Cardiology Office Note   Date:  05/06/2017   ID:  Paige [name], Paige [name] 1951/03/07, MRN [id number]  PCP:  Sharilyn Sites, MD  Cardiologist:   Jenkins Rouge, MD   Chief Complaint  Patient presents with  . Coronary Artery Disease  . Medication Managment      History of Present Illness: Paige [name] is a 66 y.o. female who presents for evaluation of CAD.  Had STEMI in [place] over 50 pages of records.    BMS to culprit RCA MI 01/23/15  Followed by CABG with LIMA to LAD, SVG RCA and SVG OM EF normal no valve disease  D/C with no DAT   And high dose lipitor   No preceding angina dyspnea  On day of MI did have nausea and diaphoresis with chest pain but never a problem before that day  Compliant with meds  Spend winter in [place]  Usually till April  Has two daughters and two grand daughters in [place]    Past Medical History:  Diagnosis Date  . Coronary artery disease   . Hypertension   . STEMI (ST elevation myocardial infarction) [facility])     Past Surgical History:  Procedure Laterality Date  . CARDIAC CATHETERIZATION    . CORONARY ARTERY BYPASS GRAFT       Current Outpatient Prescriptions  Medication Sig Dispense Refill  . aspirin 81 MG tablet Take 81 mg by mouth daily.    Marland Kitchen atorvastatin (LIPITOR) 40 MG tablet Take 1 tablet (40 mg total) by mouth daily at 6 PM. 90 tablet 3  . cetirizine (ZYRTEC) 10 MG tablet Take 10 mg by mouth daily.    . Cholecalciferol (VITAMIN D3) 5000 UNITS CAPS Take 5,000 Int'l Units by mouth daily.    Marland Kitchen levothyroxine (SYNTHROID, LEVOTHROID) 75 MCG tablet Take 75 mcg by mouth daily before breakfast.    . meloxicam (MOBIC) 15 MG tablet Take 15 mg by mouth daily as needed for pain.     . metoprolol tartrate (LOPRESSOR) 25 MG tablet Take 1 tablet (25 mg total) by mouth daily. 90 tablet 3  . Multiple Vitamin (MULTIVITAMIN) tablet Take 1 tablet  by mouth daily.    . nitroGLYCERIN (NITROSTAT) 0.4 MG SL tablet Place 1 tablet (0.4 mg total) under the tongue every 5 (five) minutes as needed. 25 tablet 3  . Omega-3 Fatty Acids (FISH OIL) 1000 MG CAPS Take 1 capsule by mouth daily.     No current facility-administered medications for this visit.     Allergies:   Patient has no known allergies.    Social History:  The patient  reports that she has never smoked. She has never used smokeless tobacco. She reports that she does not drink alcohol or use drugs.   Family History:  The patient's family history includes Breast cancer in her mother; Heart disease in her father.    ROS:  Please see the history of present illness.   Otherwise, review of systems are positive for none.   All other systems are reviewed and negative.    PHYSICAL EXAM: VS:  BP 138/78 (BP Location: Left Arm, Patient Position: Sitting, Cuff Size: Normal)   Pulse (!) 47   Ht 5\' 4"  (1.626 m)   Wt 152 lb (68.9 kg)   SpO2 99%   BMI 26.09 kg/m  , BMI Body mass index is  26.09 kg/m. Affect appropriate Healthy:  appears stated age 58: normal Neck supple with no adenopathy JVP normal no bruits no thyromegaly Lungs clear with no wheezing and good diaphragmatic motion Heart:  S1/S2 no murmur, no rub, gallop or click post sternotomy PMI normal Abdomen: benighn, BS positve, no tenderness, no AAA no bruit.  No HSM or HJR Distal pulses intact with no bruits No edema Neuro non-focal Skin warm and dry No muscular weakness    EKG:  03/24/15  SR rate 50  Low voltage otherwise normal  04/29/16  SR rate 53 low voltage  05/06/17 SR rate 44 low voltage otherwise normal   Recent Labs: 03/11/2017: ALT 33    Lipid Panel    Component Value Date/Time   CHOL 158 03/11/2017 0833   TRIG 64 03/11/2017 0833   HDL 54 03/11/2017 0833   CHOLHDL 2.9 03/11/2017 0833   LDLCALC 91 03/11/2017 0833      Wt Readings from Last 3 Encounters:  05/06/17 152 lb (68.9 kg)  04/29/16 179  lb 12.8 oz (81.6 kg)  09/24/15 180 lb 3.2 oz (81.7 kg)      Other studies Reviewed: Additional studies/ records that were reviewed today include: [facility] CATH and CABG reports Echo and d/c summary see HPI.    ASSESSMENT AND PLAN:  1.  CAD:  01/2015 with IMI preserved EF  Rx BMS and staged CABG.   Continue ASA and beta blocker Off plavix now with BMS  2. Chol  lipitor 40 mg f/u labs Dr Hilma Favors  3. Thyroid:  Dose recently lowered f/u TSH with primary    Current medicines are reviewed at length with the patient today.  The patient does not have concerns regarding medicines.  The following changes have been made:     Labs/ tests ordered today include:  None    Orders Placed This Encounter  Procedures  . EKG 12-Lead     Disposition:   FU with me in a year      Signed, Jenkins Rouge, MD  05/06/2017 9:32 AM    [facility] [address], [place], [place]  [address] Phone: [phone]; Fax: [phone]

## 2017-05-06 ENCOUNTER — Ambulatory Visit (INDEPENDENT_AMBULATORY_CARE_PROVIDER_SITE_OTHER): Payer: Medicare Other | Admitting: Cardiovascular Disease

## 2017-05-06 ENCOUNTER — Encounter: Payer: Self-pay | Admitting: Cardiovascular Disease

## 2017-05-06 VITALS — BP 138/78 | HR 47 | Ht 64.0 in | Wt 152.0 lb

## 2017-05-06 DIAGNOSIS — I2583 Coronary atherosclerosis due to lipid rich plaque: Secondary | ICD-10-CM

## 2017-05-06 DIAGNOSIS — Z79899 Other long term (current) drug therapy: Secondary | ICD-10-CM

## 2017-05-06 DIAGNOSIS — I2510 Atherosclerotic heart disease of native coronary artery without angina pectoris: Secondary | ICD-10-CM

## 2017-05-06 MED ORDER — METOPROLOL TARTRATE 25 MG PO TABS: 25.0000 mg | ORAL_TABLET | Freq: Every day | ORAL | 3 refills | Status: DC

## 2017-05-06 NOTE — Patient Instructions (Addendum)
Medication Instructions:  Your physician has recommended you make the following change in your medication:  1-Decrease Metoprolol to 25 mg by mouth daily  Labwork: NONE  Testing/Procedures: NONE  Follow-Up: Your physician wants you to follow-up in: 12 months with Dr. Johnsie Cancel. You will receive a reminder letter in the mail two months in advance. If you don't receive a letter, please call our office to schedule the follow-up appointment.   If you need a refill on your cardiac medications before your next appointment, please call your pharmacy.

## 2017-05-09 ENCOUNTER — Other Ambulatory Visit: Payer: Self-pay | Admitting: Cardiovascular Disease

## 2017-05-09 MED ORDER — METOPROLOL TARTRATE 25 MG PO TABS: 25.0000 mg | ORAL_TABLET | Freq: Every day | ORAL | 3 refills | Status: DC

## 2017-05-09 MED ORDER — ATORVASTATIN CALCIUM 40 MG PO TABS: 40.0000 mg | ORAL_TABLET | Freq: Every day | ORAL | 3 refills | Status: DC

## 2017-05-09 NOTE — Telephone Encounter (Signed)
Pt's Rx was sent to pt's pharmacy as requested. Confirmation received.  °

## 2017-05-17 DIAGNOSIS — Z0000 Encounter for general adult medical examination without abnormal findings: Secondary | ICD-10-CM | POA: Diagnosis not present

## 2017-05-17 DIAGNOSIS — E039 Hypothyroidism, unspecified: Secondary | ICD-10-CM | POA: Diagnosis not present

## 2017-05-17 DIAGNOSIS — E782 Mixed hyperlipidemia: Secondary | ICD-10-CM | POA: Diagnosis not present

## 2017-05-17 DIAGNOSIS — Z6826 Body mass index (BMI) 26.0-26.9, adult: Secondary | ICD-10-CM | POA: Diagnosis not present

## 2017-05-17 DIAGNOSIS — I2510 Atherosclerotic heart disease of native coronary artery without angina pectoris: Secondary | ICD-10-CM | POA: Diagnosis not present

## 2017-05-17 DIAGNOSIS — R7309 Other abnormal glucose: Secondary | ICD-10-CM | POA: Diagnosis not present

## 2017-05-17 DIAGNOSIS — Z1389 Encounter for screening for other disorder: Secondary | ICD-10-CM | POA: Diagnosis not present

## 2017-07-20 DIAGNOSIS — L821 Other seborrheic keratosis: Secondary | ICD-10-CM | POA: Diagnosis not present

## 2017-07-20 DIAGNOSIS — L910 Hypertrophic scar: Secondary | ICD-10-CM | POA: Diagnosis not present

## 2017-07-20 DIAGNOSIS — D229 Melanocytic nevi, unspecified: Secondary | ICD-10-CM | POA: Diagnosis not present

## 2017-08-10 DIAGNOSIS — L910 Hypertrophic scar: Secondary | ICD-10-CM | POA: Diagnosis not present

## 2017-09-08 DIAGNOSIS — Z23 Encounter for immunization: Secondary | ICD-10-CM | POA: Diagnosis not present

## 2017-09-26 DIAGNOSIS — L910 Hypertrophic scar: Secondary | ICD-10-CM | POA: Diagnosis not present

## 2017-10-11 DIAGNOSIS — Z01419 Encounter for gynecological examination (general) (routine) without abnormal findings: Secondary | ICD-10-CM | POA: Diagnosis not present

## 2017-10-11 DIAGNOSIS — Z124 Encounter for screening for malignant neoplasm of cervix: Secondary | ICD-10-CM | POA: Diagnosis not present

## 2017-11-08 DIAGNOSIS — Z1231 Encounter for screening mammogram for malignant neoplasm of breast: Secondary | ICD-10-CM | POA: Diagnosis not present

## 2017-11-27 DIAGNOSIS — J029 Acute pharyngitis, unspecified: Secondary | ICD-10-CM | POA: Diagnosis not present

## 2017-11-27 DIAGNOSIS — J320 Chronic maxillary sinusitis: Secondary | ICD-10-CM | POA: Diagnosis not present

## 2018-04-07 ENCOUNTER — Other Ambulatory Visit: Payer: Self-pay | Admitting: Cardiovascular Disease

## 2018-04-07 MED ORDER — ATORVASTATIN CALCIUM 40 MG PO TABS: 40.0000 mg | ORAL_TABLET | Freq: Every day | ORAL | 0 refills | Status: DC

## 2018-05-18 DIAGNOSIS — E782 Mixed hyperlipidemia: Secondary | ICD-10-CM | POA: Diagnosis not present

## 2018-05-18 DIAGNOSIS — E663 Overweight: Secondary | ICD-10-CM | POA: Diagnosis not present

## 2018-05-18 DIAGNOSIS — Z1389 Encounter for screening for other disorder: Secondary | ICD-10-CM | POA: Diagnosis not present

## 2018-05-18 DIAGNOSIS — R7309 Other abnormal glucose: Secondary | ICD-10-CM | POA: Diagnosis not present

## 2018-05-18 DIAGNOSIS — I2510 Atherosclerotic heart disease of native coronary artery without angina pectoris: Secondary | ICD-10-CM | POA: Diagnosis not present

## 2018-05-18 DIAGNOSIS — Z0001 Encounter for general adult medical examination with abnormal findings: Secondary | ICD-10-CM | POA: Diagnosis not present

## 2018-05-18 DIAGNOSIS — Z6826 Body mass index (BMI) 26.0-26.9, adult: Secondary | ICD-10-CM | POA: Diagnosis not present

## 2018-05-18 DIAGNOSIS — E039 Hypothyroidism, unspecified: Secondary | ICD-10-CM | POA: Diagnosis not present

## 2018-05-18 DIAGNOSIS — Z959 Presence of cardiac and vascular implant and graft, unspecified: Secondary | ICD-10-CM | POA: Diagnosis not present

## 2018-05-22 NOTE — Progress Notes (Signed)
Patient ID: Paige [name], female   DOB: 12-28-1950, 67 y.o.   MRN: [id number]     Cardiology Office Note   Date:  05/26/2018   ID:  Paige [name], Paige [name] 18-Jul-1951, MRN [id number]  PCP:  Sharilyn Sites, MD  Cardiologist:   Jenkins Rouge, MD   No chief complaint on file.     History of Present Illness:  67 y.o. with IMI in [place] 01/23/15 Rx with acute BMS then had CABG with LIMA to LAD, SVG to RCA and SVG to OM EF preserved   No preceding angina dyspnea  On day of MI did have nausea and diaphoresis with chest pain but never a problem before that day  Compliant with meds  Spend winter in [place]  Usually till April  Has two daughters and two grand daughters in [place]   No angina Walks 4 miles / day Needs refill on meds   Past Medical History:  Diagnosis Date  . Coronary artery disease   . Hypertension   . STEMI (ST elevation myocardial infarction) [facility])     Past Surgical History:  Procedure Laterality Date  . CARDIAC CATHETERIZATION    . CORONARY ARTERY BYPASS GRAFT       Current Outpatient Medications  Medication Sig Dispense Refill  . aspirin 81 MG tablet Take 81 mg by mouth daily.    Marland Kitchen atorvastatin (LIPITOR) 40 MG tablet Take 1 tablet (40 mg total) by mouth daily at 6 PM. Please keep upcoming appt for future refills. Thank you 90 tablet 0  . cetirizine (ZYRTEC) 10 MG tablet Take 10 mg by mouth daily.    . Cholecalciferol (VITAMIN D3) 5000 UNITS CAPS Take 5,000 Int'l Units by mouth daily.    Marland Kitchen levothyroxine (SYNTHROID, LEVOTHROID) 75 MCG tablet Take 75 mcg by mouth daily before breakfast.    . meloxicam (MOBIC) 15 MG tablet Take 15 mg by mouth daily as needed for pain.     . metoprolol tartrate (LOPRESSOR) 25 MG tablet Take 1 tablet (25 mg total) by mouth daily. 90 tablet 3  . Multiple Vitamin (MULTIVITAMIN) tablet Take 1 tablet by mouth daily.    . nitroGLYCERIN (NITROSTAT) 0.4 MG SL tablet Place 1 tablet (0.4 mg total) under the tongue every 5  (five) minutes as needed. 25 tablet 3   No current facility-administered medications for this visit.     Allergies:   Patient has no known allergies.    Social History:  The patient  reports that she has never smoked. She has never used smokeless tobacco. She reports that she does not drink alcohol or use drugs.   Family History:  The patient's family history includes Breast cancer in her mother; Heart disease in her father.    ROS:  Please see the history of present illness.   Otherwise, review of systems are positive for none.   All other systems are reviewed and negative.    PHYSICAL EXAM: VS:  BP 134/82   Pulse (!) 54   Ht 5\' 4"  (1.626 m)   Wt 148 lb 8 oz (67.4 kg)   SpO2 98%   BMI 25.49 kg/m  , BMI Body mass index is 25.49 kg/m. Affect appropriate Healthy:  appears stated age 67: normal Neck supple with no adenopathy JVP normal no bruits no thyromegaly Lungs clear with no wheezing and good diaphragmatic motion Heart:  S1/S2 no murmur, no rub, gallop or click PMI normal post sternotomy  Abdomen: benighn, BS positve, no tenderness,  no AAA no bruit.  No HSM or HJR Distal pulses intact with no bruits No edema Neuro non-focal Skin warm and dry No muscular weakness     EKG:   05/26/18 SR rate 51 normal slightly low voltage   Recent Labs: No results found for requested labs within last 8760 hours.    Lipid Panel    Component Value Date/Time   CHOL 158 03/11/2017 0833   TRIG 64 03/11/2017 0833   HDL 54 03/11/2017 0833   CHOLHDL 2.9 03/11/2017 0833   LDLCALC 91 03/11/2017 0833      Wt Readings from Last 3 Encounters:  05/26/18 148 lb 8 oz (67.4 kg)  05/06/17 152 lb (68.9 kg)  04/29/16 179 lb 12.8 oz (81.6 kg)      Other studies Reviewed: Additional studies/ records that were reviewed today include: [facility] hospital CATH and CABG reports Echo and d/c summary see HPI.    ASSESSMENT AND PLAN:  1.  CAD:  01/2015 with IMI preserved EF  Rx BMS and  staged CABG.   Continue ASA and beta blocker    2.  Chol  lipitor 40 mg f/u labs Dr Hilma Favors  3.  Thyroid:  On replacement f/u labs with primary    Current medicines are reviewed at length with the patient today.  The patient does not have concerns regarding medicines.  The following changes have been made:     Labs/ tests ordered today include:  None    No orders of the defined types were placed in this encounter.    Disposition:   FU with me in a year      Signed, Jenkins Rouge, MD  05/26/2018 9:00 AM    [facility] [address], [place], [place]  [address] Phone: [phone]; Fax: [phone]

## 2018-05-26 ENCOUNTER — Encounter: Payer: Self-pay | Admitting: Cardiovascular Disease

## 2018-05-26 ENCOUNTER — Ambulatory Visit (INDEPENDENT_AMBULATORY_CARE_PROVIDER_SITE_OTHER): Payer: Medicare Other | Admitting: Cardiovascular Disease

## 2018-05-26 ENCOUNTER — Telehealth: Payer: Self-pay | Admitting: Cardiovascular Disease

## 2018-05-26 VITALS — BP 134/82 | HR 54 | Ht 64.0 in | Wt 148.5 lb

## 2018-05-26 DIAGNOSIS — I2510 Atherosclerotic heart disease of native coronary artery without angina pectoris: Secondary | ICD-10-CM | POA: Diagnosis not present

## 2018-05-26 DIAGNOSIS — I2583 Coronary atherosclerosis due to lipid rich plaque: Secondary | ICD-10-CM

## 2018-05-26 MED ORDER — NITROGLYCERIN 0.4 MG SL SUBL: 0.4000 mg | SUBLINGUAL_TABLET | SUBLINGUAL | 3 refills | Status: DC | PRN

## 2018-05-26 MED ORDER — ATORVASTATIN CALCIUM 40 MG PO TABS: 40.0000 mg | ORAL_TABLET | Freq: Every day | ORAL | 3 refills | Status: DC

## 2018-05-26 MED ORDER — METOPROLOL TARTRATE 25 MG PO TABS: 25.0000 mg | ORAL_TABLET | Freq: Every day | ORAL | 3 refills | Status: DC

## 2018-05-26 NOTE — Patient Instructions (Addendum)
Medication Instructions:  Your physician has recommended you make the following change in your medication:  Take 1 Nitroglycerin (NTG), under your tongue, while sitting. If no relief of pain may repeat NTG, one tab every 5 minutes up to 3 tablets total over 15 minutes. If no relief CALL 911. If you have dizziness/lightheadness while taking NTG, stop taking and call 911.  Labwork: NONE  Testing/Procedures: NONE  Follow-Up: Your physician wants you to follow-up in: 6 months with Dr. Johnsie Cancel in [place]. You will receive a reminder letter in the mail two months in advance. If you don't receive a letter, please call our office to schedule the follow-up appointment.   If you need a refill on your cardiac medications before your next appointment, please call your pharmacy.

## 2018-05-26 NOTE — Telephone Encounter (Signed)
New Message   Pt says that she seen Johnsie Cancel today and on her discharge papers it instructed her to schedule an appt with Nishan in 6 months in [place] but she doesn't know why when sge was told everything was ok with her. Please call

## 2018-05-26 NOTE — Telephone Encounter (Signed)
Informed patient that Dr. Kyla Balzarine note stated she could see him in a year. Informed patient that he can see her in [place] or [place]. Patient verbalized understanding.

## 2018-07-01 DIAGNOSIS — H2513 Age-related nuclear cataract, bilateral: Secondary | ICD-10-CM | POA: Diagnosis not present

## 2018-07-12 DIAGNOSIS — Z6826 Body mass index (BMI) 26.0-26.9, adult: Secondary | ICD-10-CM | POA: Diagnosis not present

## 2018-07-12 DIAGNOSIS — E663 Overweight: Secondary | ICD-10-CM | POA: Diagnosis not present

## 2018-07-12 DIAGNOSIS — H6993 Unspecified Eustachian tube disorder, bilateral: Secondary | ICD-10-CM | POA: Diagnosis not present

## 2018-07-12 DIAGNOSIS — J019 Acute sinusitis, unspecified: Secondary | ICD-10-CM | POA: Diagnosis not present

## 2018-07-25 DIAGNOSIS — L910 Hypertrophic scar: Secondary | ICD-10-CM | POA: Diagnosis not present

## 2018-07-25 DIAGNOSIS — L821 Other seborrheic keratosis: Secondary | ICD-10-CM | POA: Diagnosis not present

## 2018-07-25 DIAGNOSIS — D229 Melanocytic nevi, unspecified: Secondary | ICD-10-CM | POA: Diagnosis not present

## 2018-07-25 DIAGNOSIS — B009 Herpesviral infection, unspecified: Secondary | ICD-10-CM | POA: Diagnosis not present

## 2018-09-05 DIAGNOSIS — Z23 Encounter for immunization: Secondary | ICD-10-CM | POA: Diagnosis not present

## 2018-09-14 ENCOUNTER — Encounter: Payer: Self-pay | Admitting: Gastroenterology

## 2018-10-10 ENCOUNTER — Ambulatory Visit (INDEPENDENT_AMBULATORY_CARE_PROVIDER_SITE_OTHER): Payer: Self-pay

## 2018-10-10 DIAGNOSIS — Z1211 Encounter for screening for malignant neoplasm of colon: Secondary | ICD-10-CM

## 2018-10-10 NOTE — Progress Notes (Signed)
Pt came in for a nurse visit today to schedule 10 year repeat tcs. She will be leaving [place] on 11/29/18 to go to [place] for the winter and will not be back until April. We do not have anything available at this time, but I advised the pt that if anyone were to cancel, I will call her. If there are no cancellations she will follow up with [place] in April when she returns from [place]. Pt was agreeable with this plan. She is not having any problems at this time.

## 2018-10-10 NOTE — Progress Notes (Signed)
Gastroenterology Pre-Procedure Review  Request Date:10/10/18 Requesting Physician: 10 year recall SLF 10/28/08  No polyps  PATIENT REVIEW QUESTIONS: The patient responded to the following health history questions as indicated:    1. Diabetes Melitis: no 2. Joint replacements in the past 12 months: no 3. Major health problems in the past 3 months: no 4. Has an artificial valve or MVP: no 5. Has a defibrillator: no 6. Has been advised in past to take antibiotics in advance of a procedure like teeth cleaning: no 7. Family history of colon cancer: no  8. Alcohol Use: no 9. History of sleep apnea: no  10. History of coronary artery or other vascular stents placed within the last 12 months: no 11. History of any prior anesthesia complications: no    MEDICATIONS & ALLERGIES:    Patient reports the following regarding taking any blood thinners:   Plavix? no Aspirin? yes (81mg ) Coumadin? no Brilinta? no Xarelto? no Eliquis? no Pradaxa? no Savaysa? no Effient? no  Patient confirms/reports the following medications:  Current Outpatient Medications  Medication Sig Dispense Refill  . aspirin 81 MG tablet Take 81 mg by mouth daily.    Marland Kitchen atorvastatin (LIPITOR) 40 MG tablet Take 1 tablet (40 mg total) by mouth daily at 6 PM. 90 tablet 3  . Cholecalciferol (VITAMIN D3) 5000 UNITS CAPS Take 5,000 Int'l Units by mouth daily.    Marland Kitchen levothyroxine (SYNTHROID, LEVOTHROID) 75 MCG tablet Take 75 mcg by mouth daily before breakfast.    . metoprolol tartrate (LOPRESSOR) 25 MG tablet Take 1 tablet (25 mg total) by mouth daily. 90 tablet 3  . Multiple Vitamin (MULTIVITAMIN) tablet Take 1 tablet by mouth daily.    . nitroGLYCERIN (NITROSTAT) 0.4 MG SL tablet Place 1 tablet (0.4 mg total) under the tongue every 5 (five) minutes as needed. 25 tablet 3   No current facility-administered medications for this visit.     Patient confirms/reports the following allergies:  No Known Allergies  No orders of  the defined types were placed in this encounter.   AUTHORIZATION INFORMATION Primary Insurance: medicare,  ID #: [id number] Pre-Cert / Auth required: no  SCHEDULE INFORMATION: Procedure has been scheduled as follows:  Date:, Time: Location:   This Gastroenterology Pre-Precedure Review Form is being routed to the following provider(s): Neil Crouch, PA

## 2018-10-11 NOTE — Progress Notes (Signed)
noted 

## 2018-10-26 MED ORDER — NA SULFATE-K SULFATE-MG SULF 17.5-3.13-1.6 GM/177ML PO SOLN: 1.0000 | ORAL | 0 refills | Status: DC

## 2018-10-26 NOTE — Progress Notes (Signed)
Had a cancellation for 11/10/18, called pt and she accepted this appt. rx and instructions faxed to the pharmacy and mailed a copy of the instructions to the pt.

## 2018-10-26 NOTE — Patient Instructions (Signed)
EVAGELIA KNACK  1951/03/20 MRN: [id number]     Procedure Date: 11/10/18 Time to register: 9:30am Place to register: [facility] Procedure Time: 10:30am Scheduled provider: Barney Drain, MD    PREPARATION FOR COLONOSCOPY WITH SUPREP BOWEL PREP KIT  Note: Suprep Bowel Prep Kit is a split-dose (2day) regimen. Consumption of BOTH 6-ounce bottles is required for a complete prep.  Please notify us immediately if you are diabetic, take iron supplements, or if you are on Coumadin or any other blood thinners.                                                                                                                                               1 DAY BEFORE PROCEDURE:  DATE: 11/09/18   DAY: Thursday  clear liquids the entire day - NO SOLID FOOD.   At 6:00pm: Complete steps 1 through 4 below, using ONE (1) 6-ounce bottle, before going to bed. Step 1:  Pour ONE (1) 6-ounce bottle of SUPREP liquid into the mixing container.  Step 2:  Add cool drinking water to the 16 ounce line on the container and mix.  Note: Dilute the solution concentrate as directed prior to use. Step 3:  DRINK ALL the liquid in the container. Step 4:  You MUST drink an additional two (2) or more 16 ounce containers of water over the next one (1) hour.   Continue clear liquids.  DAY OF PROCEDURE:   DATE: 11/10/18   DAY: Friday If you take medications for your heart, blood pressure, or breathing, you may take these medications.   5 hours before your procedure at :5:30am Step 1:  Pour ONE (1) 6-ounce bottle of SUPREP liquid into the mixing container.  Step 2:  Add cool drinking water to the 16 ounce line on the container and mix.  Note: Dilute the solution concentrate as directed prior to use. Step 3:  DRINK ALL the liquid in the container. Step 4:  You MUST drink an additional two (2) or more 16 ounce containers of water over the next one (1) hour. You MUST complete the final glass of water at least 3 hours  before your colonoscopy.   Nothing by mouth past 7:30am  You may take your morning medications with sip of water unless we have instructed otherwise.    Please see below for Dietary Information.  CLEAR LIQUIDS INCLUDE:  Water Jello (NOT red in color)   Ice Popsicles (NOT red in color)   Tea (sugar ok, no milk/cream) Powdered fruit flavored drinks  Coffee (sugar ok, no milk/cream) Gatorade/ Lemonade/ Kool-Aid  (NOT red in color)   Juice: apple, white grape, white cranberry Soft drinks  Clear bullion, consomme, broth (fat free beef/chicken/vegetable)  Carbonated beverages (any kind)  Strained chicken noodle soup Hard Candy   Remember: Clear liquids are liquids that will allow you  to see your fingers on the other side of a clear glass. Be sure liquids are NOT red in color, and not cloudy, but CLEAR.  DO NOT EAT OR DRINK ANY OF THE FOLLOWING:  Dairy products of any kind   Cranberry juice Tomato juice / V8 juice   Grapefruit juice Orange juice     Red grape juice  Do not eat any solid foods, including such foods as: cereal, oatmeal, yogurt, fruits, vegetables, creamed soups, eggs, bread, crackers, pureed foods in a blender, etc.   HELPFUL HINTS FOR DRINKING PREP SOLUTION:   Make sure prep is extremely cold. Mix and refrigerate the the morning of the prep. You may also put in the freezer.   You may try mixing some Crystal Light or Country Time Lemonade if you prefer. Mix in small amounts; add more if necessary.  Try drinking through a straw  Rinse mouth with water or a mouthwash between glasses, to remove after-taste.  Try sipping on a cold beverage /ice/ popsicles between glasses of prep.  Place a piece of sugar-free hard candy in mouth between glasses.  If you become nauseated, try consuming smaller amounts, or stretch out the time between glasses. Stop for 30-60 minutes, then slowly start back drinking.     OTHER INSTRUCTIONS  You will need a responsible adult at least  68 years of age to accompany you and drive you home. This person must remain in the waiting room during your procedure. The hospital will cancel your procedure if you do not have a responsible adult with you.   1. Wear loose fitting clothing that is easily removed. 2. Leave jewelry and other valuables at home.  3. Remove all body piercing jewelry and leave at home. 4. Total time from sign-in until discharge is approximately 2-3 hours. 5. You should go home directly after your procedure and rest. You can resume normal activities the day after your procedure. 6. The day of your procedure you should not:  Drive  Make legal decisions  Operate machinery  Drink alcohol  Return to work   You may call the office (Dept: [phone]) before 5:00pm, or page the doctor on call ([phone]) after 5:00pm, for further instructions, if necessary.   Insurance Information YOU WILL NEED TO CHECK WITH YOUR INSURANCE COMPANY FOR THE BENEFITS OF COVERAGE YOU HAVE FOR THIS PROCEDURE.  UNFORTUNATELY, NOT ALL INSURANCE COMPANIES HAVE BENEFITS TO COVER ALL OR PART OF THESE TYPES OF PROCEDURES.  IT IS YOUR RESPONSIBILITY TO CHECK YOUR BENEFITS, HOWEVER, WE WILL BE GLAD TO ASSIST YOU WITH ANY CODES YOUR INSURANCE COMPANY MAY NEED.    PLEASE NOTE THAT MOST INSURANCE COMPANIES WILL NOT COVER A SCREENING COLONOSCOPY FOR PEOPLE UNDER THE AGE OF 50  IF YOU HAVE BCBS INSURANCE, YOU MAY HAVE BENEFITS FOR A SCREENING COLONOSCOPY BUT IF POLYPS ARE FOUND THE DIAGNOSIS WILL CHANGE AND THEN YOU MAY HAVE A DEDUCTIBLE THAT WILL NEED TO BE MET. SO PLEASE MAKE SURE YOU CHECK YOUR BENEFITS FOR A SCREENING COLONOSCOPY AS WELL AS A DIAGNOSTIC COLONOSCOPY.      

## 2018-10-26 NOTE — Addendum Note (Signed)
Addended by: Claudina Lick on: 10/26/2018 03:29 PM   Modules accepted: Orders, SmartSet

## 2018-10-27 NOTE — Progress Notes (Signed)
Ok to schedule.

## 2018-11-10 ENCOUNTER — Encounter (HOSPITAL_COMMUNITY)
Admission: RE | Disposition: A | Discharge status: Home / Self Care | Payer: Self-pay | Source: Ambulatory Visit | Attending: Gastroenterology

## 2018-11-10 ENCOUNTER — Other Ambulatory Visit: Payer: Self-pay

## 2018-11-10 ENCOUNTER — Encounter (HOSPITAL_COMMUNITY): Payer: Self-pay

## 2018-11-10 ENCOUNTER — Ambulatory Visit (HOSPITAL_COMMUNITY)
Admission: RE | Admit: 2018-11-10 | Discharge: 2018-11-10 | Disposition: A | Discharge status: Home / Self Care | Payer: Medicare Other | Source: Ambulatory Visit | Attending: Gastroenterology | Admitting: Gastroenterology

## 2018-11-10 DIAGNOSIS — I252 Old myocardial infarction: Secondary | ICD-10-CM | POA: Diagnosis not present

## 2018-11-10 DIAGNOSIS — Z1211 Encounter for screening for malignant neoplasm of colon: Secondary | ICD-10-CM | POA: Insufficient documentation

## 2018-11-10 DIAGNOSIS — Z791 Long term (current) use of non-steroidal anti-inflammatories (NSAID): Secondary | ICD-10-CM | POA: Diagnosis not present

## 2018-11-10 DIAGNOSIS — Z7982 Long term (current) use of aspirin: Secondary | ICD-10-CM | POA: Insufficient documentation

## 2018-11-10 DIAGNOSIS — I2510 Atherosclerotic heart disease of native coronary artery without angina pectoris: Secondary | ICD-10-CM | POA: Insufficient documentation

## 2018-11-10 DIAGNOSIS — I10 Essential (primary) hypertension: Secondary | ICD-10-CM | POA: Diagnosis not present

## 2018-11-10 DIAGNOSIS — K648 Other hemorrhoids: Secondary | ICD-10-CM | POA: Insufficient documentation

## 2018-11-10 DIAGNOSIS — Z951 Presence of aortocoronary bypass graft: Secondary | ICD-10-CM | POA: Diagnosis not present

## 2018-11-10 DIAGNOSIS — K5730 Diverticulosis of large intestine without perforation or abscess without bleeding: Secondary | ICD-10-CM | POA: Insufficient documentation

## 2018-11-10 DIAGNOSIS — Q438 Other specified congenital malformations of intestine: Secondary | ICD-10-CM

## 2018-11-10 HISTORY — PX: COLONOSCOPY: SHX5424

## 2018-11-10 SURGERY — COLONOSCOPY
Anesthesia: Moderate Sedation

## 2018-11-10 MED ORDER — MEPERIDINE HCL 100 MG/ML IJ SOLN
INTRAMUSCULAR | Status: DC | PRN
  Administered 2018-11-10: 25 mg

## 2018-11-10 MED ORDER — ATROPINE SULFATE 1 MG/ML IJ SOLN
INTRAMUSCULAR | Status: AC
  Filled 2018-11-10: qty 1

## 2018-11-10 MED ORDER — MIDAZOLAM HCL 5 MG/5ML IJ SOLN
INTRAMUSCULAR | Status: DC | PRN
  Administered 2018-11-10 (×2): 2 mg via INTRAVENOUS

## 2018-11-10 MED ORDER — MEPERIDINE HCL 100 MG/ML IJ SOLN
INTRAMUSCULAR | Status: AC
  Filled 2018-11-10: qty 2

## 2018-11-10 MED ORDER — SODIUM CHLORIDE 0.9 % IV SOLN
INTRAVENOUS | Status: DC
  Administered 2018-11-10: 1000 mL via INTRAVENOUS

## 2018-11-10 MED ORDER — MIDAZOLAM HCL 5 MG/5ML IJ SOLN
INTRAMUSCULAR | Status: AC
  Filled 2018-11-10: qty 10

## 2018-11-10 MED ORDER — ATROPINE SULFATE 1 MG/ML IJ SOLN
INTRAMUSCULAR | Status: DC | PRN
  Administered 2018-11-10: .5 mg via INTRAVENOUS

## 2018-11-10 MED ORDER — STERILE WATER FOR IRRIGATION IR SOLN
Status: DC | PRN
  Administered 2018-11-10: 1.5 mL

## 2018-11-10 NOTE — Discharge Instructions (Signed)
YOU DID NOT HAVE ANY POLYPS.  You have small internal and external hemorrhoids and diverticulosis in your left colon.   DRINK WATER TO KEEP YOUR URINE LIGHT YELLOW.  FOLLOW A HIGH FIBER DIET. AVOID ITEMS THAT CAUSE BLOATING & GAS. See info below.  We do not routinely screen for polyps after the age of 38.  Colonoscopy Care After Read the instructions outlined below and refer to this sheet in the next week. These discharge instructions provide you with general information on caring for yourself after you leave the hospital. While your treatment has been planned according to the most current medical practices available, unavoidable complications occasionally occur. If you have any problems or questions after discharge, call DR. Naraya Stoneberg, [phone].  ACTIVITY  You may resume your regular activity, but move at a slower pace for the next 24 hours.   Take frequent rest periods for the next 24 hours.   Walking will help get rid of the air and reduce the bloated feeling in your belly (abdomen).   No driving for 24 hours (because of the medicine (anesthesia) used during the test).   You may shower.   Do not sign any important legal documents or operate any machinery for 24 hours (because of the anesthesia used during the test).    NUTRITION  Drink plenty of fluids.   You may resume your normal diet as instructed by your doctor.   Begin with a light meal and progress to your normal diet. Heavy or fried foods are harder to digest and may make you feel sick to your stomach (nauseated).   Avoid alcoholic beverages for 24 hours or as instructed.    MEDICATIONS  You may resume your normal medications.   WHAT YOU CAN EXPECT TODAY  Some feelings of bloating in the abdomen.   Passage of more gas than usual.   Spotting of blood in your stool or on the toilet paper  .  IF YOU HAD POLYPS REMOVED DURING THE COLONOSCOPY:  Eat a soft diet IF YOU HAVE NAUSEA, BLOATING, ABDOMINAL PAIN,  OR VOMITING.    FINDING OUT THE RESULTS OF YOUR TEST Not all test results are available during your visit. DR. Oneida Alar WILL CALL YOU WITHIN 7 DAYS OF YOUR PROCEDUE WITH YOUR RESULTS. Do not assume everything is normal if you have not heard from DR. Rohit Deloria IN ONE WEEK, CALL HER OFFICE AT [phone].  SEEK IMMEDIATE MEDICAL ATTENTION AND CALL THE OFFICE: [phone] IF:  You have more than a spotting of blood in your stool.   Your belly is swollen (abdominal distention).   You are nauseated or vomiting.   You have a temperature over 101F.   You have abdominal pain or discomfort that is severe or gets worse throughout the day.  High-Fiber Diet A high-fiber diet changes your normal diet to include more whole grains, legumes, fruits, and vegetables. Changes in the diet involve replacing refined carbohydrates with unrefined foods. The calorie level of the diet is essentially unchanged. The Dietary Reference Intake (recommended amount) for adult males is 38 grams per day. For adult females, it is 25 grams per day. Pregnant and lactating women should consume 28 grams of fiber per day. Fiber is the intact part of a plant that is not broken down during digestion. Functional fiber is fiber that has been isolated from the plant to provide a beneficial effect in the body. PURPOSE  Increase stool bulk.   Ease and regulate bowel movements.   Lower cholesterol.  INDICATIONS THAT YOU NEED MORE FIBER  Constipation and hemorrhoids.   Uncomplicated diverticulosis (intestine condition) and irritable bowel syndrome.   Weight management.   As a protective measure against hardening of the arteries (atherosclerosis), diabetes, and cancer.   GUIDELINES FOR INCREASING FIBER IN THE DIET  Start adding fiber to the diet slowly. A gradual increase of about 5 more grams (2 slices of whole-wheat bread, 2 servings of most fruits or vegetables, or 1 bowl of high-fiber cereal) per day is best. Too rapid an  increase in fiber may result in constipation, flatulence, and bloating.   Drink enough water and fluids to keep your urine clear or pale yellow. Water, juice, or caffeine-free drinks are recommended. Not drinking enough fluid may cause constipation.   Eat a variety of high-fiber foods rather than one type of fiber.   Try to increase your intake of fiber through using high-fiber foods rather than fiber pills or supplements that contain small amounts of fiber.   The goal is to change the types of food eaten. Do not supplement your present diet with high-fiber foods, but replace foods in your present diet.   INCLUDE A VARIETY OF FIBER SOURCES  Replace refined and processed grains with whole grains, canned fruits with fresh fruits, and incorporate other fiber sources. White rice, white breads, and most bakery goods contain little or no fiber.   Brown whole-grain rice, buckwheat oats, and many fruits and vegetables are all good sources of fiber. These include: broccoli, Brussels sprouts, cabbage, cauliflower, beets, sweet potatoes, white potatoes (skin on), carrots, tomatoes, eggplant, squash, berries, fresh fruits, and dried fruits.   Cereals appear to be the richest source of fiber. Cereal fiber is found in whole grains and bran. Bran is the fiber-rich outer coat of cereal grain, which is largely removed in refining. In whole-grain cereals, the bran remains. In breakfast cereals, the largest amount of fiber is found in those with "bran" in their names. The fiber content is sometimes indicated on the label.   You may need to include additional fruits and vegetables each day.   In baking, for 1 cup white flour, you may use the following substitutions:   1 cup whole-wheat flour minus 2 tablespoons.   1/2 cup white flour plus 1/2 cup whole-wheat flour.   Diverticulosis Diverticulosis is a common condition that develops when small pouches (diverticula) form in the wall of the colon. The risk of  diverticulosis increases with age. It happens more often in people who eat a low-fiber diet. Most individuals with diverticulosis have no symptoms. Those individuals with symptoms usually experience belly (abdominal) pain, constipation, or loose stools (diarrhea).  HOME CARE INSTRUCTIONS  Increase the amount of fiber in your diet as directed by your caregiver or dietician. This may reduce symptoms of diverticulosis.   Drink at least 6 to 8 glasses of water each day to prevent constipation.   Try not to strain when you have a bowel movement.   Avoiding nuts and seeds to prevent complications is NOT NECESSARY.      FOODS HAVING HIGH FIBER CONTENT INCLUDE:  Fruits. Apple, peach, pear, tangerine, raisins, prunes.   Vegetables. Brussels sprouts, asparagus, broccoli, cabbage, carrot, cauliflower, romaine lettuce, spinach, summer squash, tomato, winter squash, zucchini.   Starchy Vegetables. Baked beans, kidney beans, lima beans, split peas, lentils, potatoes (with skin).   Grains. Whole wheat bread, brown rice, bran flake cereal, plain oatmeal, white rice, shredded wheat, bran muffins.    [place]  IF:  You develop increasing pain or severe bloating.   You have an oral temperature above 101F.   You develop vomiting or bowel movements that are bloody or black.

## 2018-11-10 NOTE — Op Note (Signed)
[facility] Patient Name: Paige [name] Procedure Date: 11/10/2018 10:24 AM MRN: [id number] Date of Birth: Oct 06, 1951 Attending MD: Barney Drain MD, MD CSN: [id number] Age: 67 Admit Type: Outpatient Procedure:                Colonoscopy, SCREENING Indications:              Screening for colorectal malignant neoplasm Providers:                Barney Drain MD, MD, Lurline Del, RN, Gerome Sam, RN, Randa Spike, Technician Referring MD:             Halford Chessman MD, MD Medicines:                Meperidine 25 mg IV, Midazolam 4 mg IV, Atropine                            0.5 mg IV Complications:            No immediate complications. Estimated Blood Loss:     Estimated blood loss: none. Procedure:                Pre-Anesthesia Assessment:                           - Prior to the procedure, a History and Physical                            was performed, and patient medications and                            allergies were reviewed. The patient's tolerance of                            previous anesthesia was also reviewed. The risks                            and benefits of the procedure and the sedation                            options and risks were discussed with the patient.                            All questions were answered, and informed consent                            was obtained. Prior Anticoagulants: The patient has                            taken aspirin, last dose was 1 day prior to                            procedure. ASA Grade Assessment: II - A patient  with mild systemic disease. After reviewing the                            risks and benefits, the patient was deemed in                            satisfactory condition to undergo the procedure.                            After obtaining informed consent, the colonoscope                            was passed under direct vision. Throughout the                          procedure, the patient's blood pressure, pulse, and                            oxygen saturations were monitored continuously. The                            CF-[id number] ([id number]) scope was introduced through                            the anus and advanced to the the cecum, identified                            by appendiceal orifice and ileocecal valve. The                            colonoscopy was somewhat difficult due to a                            tortuous colon. Successful completion of the                            procedure was aided by straightening and shortening                            the scope to obtain bowel loop reduction and                            COLOWRAP. The patient tolerated the procedure well.                            The quality of the bowel preparation was excellent.                            The ileocecal valve, appendiceal orifice, and                            rectum were photographed. Scope In: 10:46:00 AM Scope Out: 11:03:47 AM Scope Withdrawal Time: 0 hours 14 minutes 21 seconds  Total Procedure Duration: 0  hours 17 minutes 47 seconds  Findings:      Multiple small and large-mouthed diverticula were found in the       recto-sigmoid colon, sigmoid colon and descending colon.      The recto-sigmoid colon and sigmoid colon were mildly tortuous.      Internal hemorrhoids were found. The hemorrhoids were small. Impression:               - MILD Diverticulosis in the recto-sigmoid colon,                            in the sigmoid colon and in the descending colon.                           - TortuousLEFT colon.                           - SMALL Internal hemorrhoids. Moderate Sedation:      Moderate (conscious) sedation was administered by the endoscopy nurse       and supervised by the endoscopist. The following parameters were       monitored: oxygen saturation, heart rate, blood pressure, and response       to care. Total  physician intraservice time was 30 minutes. Recommendation:           - Patient has a contact number available for                            emergencies. The signs and symptoms of potential                            delayed complications were discussed with the                            patient. Return to normal activities tomorrow.                            Written discharge instructions were provided to the                            patient.                           - High fiber diet.                           - Continue present medications.                           - Repeat colonoscopy is not recommended due to                            current age (67 years or older) for surveillance. Procedure Code(s):        --- Professional ---                           [phone], Colonoscopy, flexible; diagnostic, including  collection of specimen(s) by brushing or washing,                            when performed (separate procedure)                           99153, Moderate sedation; each additional 15                            minutes intraservice time                           G0500, Moderate sedation services provided by the                            same physician or other qualified health care                            professional performing a gastrointestinal                            endoscopic service that sedation supports,                            requiring the presence of an independent trained                            observer to assist in the monitoring of the                            patient's level of consciousness and physiological                            status; initial 15 minutes of intra-service time;                            patient age 21 years or older (additional time may                            be reported with [phone], as appropriate) Diagnosis Code(s):        --- Professional ---                           Z12.11, Encounter  for screening for malignant                            neoplasm of colon                           K64.8, Other hemorrhoids                           K57.30, Diverticulosis of large intestine without                            perforation or  abscess without bleeding                           Q43.8, Other specified congenital malformations of                            intestine CPT copyright 2018 American Medical Association. All rights reserved. The codes documented in this report are preliminary and upon coder review may  be revised to meet current compliance requirements. Barney Drain, MD Barney Drain MD, MD 11/10/2018 11:14:38 AM This report has been signed electronically. Number of Addenda: 0

## 2018-11-10 NOTE — H&P (Signed)
Primary Care Physician:  Sharilyn Sites, MD Primary Gastroenterologist:  Dr. Oneida Alar  Pre-Procedure History & Physical: HPI:  Paige [name] is a 67 y.o. female here for [place].  Past Medical History:  Diagnosis Date  . Coronary artery disease   . Hypertension   . STEMI (ST elevation myocardial infarction) [facility])     Past Surgical History:  Procedure Laterality Date  . CARDIAC CATHETERIZATION    . CORONARY ARTERY BYPASS GRAFT      Prior to Admission medications   Medication Sig Start Date End Date Taking? Authorizing Provider  aspirin 81 MG tablet Take 81 mg by mouth at bedtime.    Yes [provider]  atorvastatin (LIPITOR) 40 MG tablet Take 1 tablet (40 mg total) by mouth daily at 6 PM. 05/26/18  Yes Josue Hector, MD  Cholecalciferol (VITAMIN D3) 5000 UNITS CAPS Take 5,000 Int'l Units by mouth daily.   Yes [provider]  ibuprofen (ADVIL,MOTRIN) 200 MG tablet Take 400 mg by mouth 2 (two) times daily as needed for headache or moderate pain.   Yes [provider]  levothyroxine (SYNTHROID, LEVOTHROID) 75 MCG tablet Take 75 mcg by mouth daily before breakfast.   Yes [provider]  metoprolol tartrate (LOPRESSOR) 25 MG tablet Take 1 tablet (25 mg total) by mouth daily. Patient taking differently: Take 25 mg by mouth every evening.  05/26/18  Yes Josue Hector, MD  Multiple Vitamin (MULTIVITAMIN) tablet Take 1 tablet by mouth daily.   Yes [provider]  Na Sulfate-K Sulfate-Mg Sulf (SUPREP BOWEL PREP KIT) 17.5-3.13-1.6 GM/177ML SOLN Take 1 kit by mouth as directed. 10/26/18  Yes Mahala Menghini, PA-C  Pseudoephedrine-Ibuprofen (ADVIL COLD/SINUS PO) Take 1 tablet by mouth daily as needed (sinuses).   Yes [provider]  nitroGLYCERIN (NITROSTAT) 0.4 MG SL tablet Place 1 tablet (0.4 mg total) under the tongue every 5 (five) minutes as needed. Patient taking differently: Place 0.4 mg under the tongue every 5  (five) minutes as needed for chest pain.  05/26/18   Josue Hector, MD    Allergies as of 10/26/2018  . (No Known Allergies)    Family History  Problem Relation Age of Onset  . Heart disease Father   . Breast cancer Mother   . Colon cancer Neg Hx   . Colon polyps Neg Hx     Social History   Socioeconomic History  . Marital status: Married    Spouse name: Not on file  . Number of children: Not on file  . Years of education: Not on file  . Highest education level: Not on file  Occupational History  . Not on file  Social Needs  . Financial resource strain: Not on file  . Food insecurity:    Worry: Not on file    Inability: Not on file  . Transportation needs:    Medical: Not on file    Non-medical: Not on file  Tobacco Use  . Smoking status: Never Smoker  . Smokeless tobacco: Never Used  Substance and Sexual Activity  . Alcohol use: No    Alcohol/week: 0.0 standard drinks  . Drug use: No  . Sexual activity: Not on file  Lifestyle  . Physical activity:    Days per week: Not on file    Minutes per session: Not on file  . Stress: Not on file  Relationships  . Social connections:    Talks on phone: Not on file  Gets together: Not on file    Attends religious service: Not on file    Active member of club or organization: Not on file    Attends meetings of clubs or organizations: Not on file    Relationship status: Not on file  . Intimate partner violence:    Fear of current or ex partner: Not on file    Emotionally abused: Not on file    Physically abused: Not on file    Forced sexual activity: Not on file  Other Topics Concern  . Not on file  Social History Narrative  . Not on file    Review of Systems: See HPI, otherwise negative ROS   Physical Exam: BP (!) 126/56   Pulse (!) 50   Temp 97.6 F (36.4 C) (Axillary)   Resp 14   Ht 5' 3.5" (1.613 m)   Wt 66.2 kg   SpO2 98%   BMI 25.46 kg/m  General:   Alert,  pleasant and cooperative in  NAD Head:  Normocephalic and atraumatic. Neck:  Supple; Lungs:  Clear throughout to auscultation.    Heart:  Regular rate and rhythm. Abdomen:  Soft, nontender and nondistended. Normal bowel sounds, without guarding, and without rebound.   Neurologic:  Alert and  oriented x4;  grossly normal neurologically.  Impression/Plan:    SCREENING  Plan:  1. TCS TODAY DISCUSSED PROCEDURE, BENEFITS, & RISKS: < 1% chance of medication reaction, bleeding, perforation, or rupture of spleen/liver.

## 2018-11-15 ENCOUNTER — Encounter (HOSPITAL_COMMUNITY): Payer: Self-pay | Admitting: Gastroenterology

## 2018-11-17 DIAGNOSIS — Z1231 Encounter for screening mammogram for malignant neoplasm of breast: Secondary | ICD-10-CM | POA: Diagnosis not present

## 2019-02-22 ENCOUNTER — Other Ambulatory Visit: Payer: Self-pay | Admitting: Cardiovascular Disease

## 2019-02-22 ENCOUNTER — Other Ambulatory Visit: Payer: Self-pay

## 2019-02-22 ENCOUNTER — Telehealth: Payer: Self-pay | Admitting: Cardiovascular Disease

## 2019-02-22 MED ORDER — METOPROLOL TARTRATE 25 MG PO TABS: 25.0000 mg | ORAL_TABLET | Freq: Every day | ORAL | 0 refills | Status: DC

## 2019-02-22 MED ORDER — ATORVASTATIN CALCIUM 40 MG PO TABS: 40.0000 mg | ORAL_TABLET | Freq: Every day | ORAL | 0 refills | Status: DC

## 2019-02-22 NOTE — Telephone Encounter (Signed)
° ° °*  STAT* If patient is at the pharmacy, call can be transferred to refill team.   1. Which medications need to be refilled? (please list name of each medication and dose if known) metoprolol tartrate (LOPRESSOR) 25 MG tablet, atorvastatin (LIPITOR) 40 MG tablet  2. Which pharmacy/location (including street and city if local pharmacy) is medication to be sent to? [facility]  3. Do they need a 30 day or 90 day supply? [place]

## 2019-02-22 NOTE — Telephone Encounter (Signed)
Per pt's husbands call - stated needs a call back to confirm the supplier of the prescription ordered today. 02/22/19

## 2019-02-22 NOTE — Addendum Note (Signed)
Addended by: Gaetano Net on: 02/22/2019 12:55 PM   Modules accepted: Orders

## 2019-02-22 NOTE — Telephone Encounter (Signed)
Returned pts call and she has been made aware that her refill of her Atorvastatin has been sent to [facility], per her request.  Pt thanked me for the return call.

## 2019-03-05 ENCOUNTER — Other Ambulatory Visit: Payer: Self-pay | Admitting: Cardiovascular Disease

## 2019-03-05 MED ORDER — ATORVASTATIN CALCIUM 40 MG PO TABS: 40.0000 mg | ORAL_TABLET | Freq: Every day | ORAL | 1 refills | Status: DC

## 2019-03-05 MED ORDER — METOPROLOL TARTRATE 25 MG PO TABS: 25.0000 mg | ORAL_TABLET | Freq: Every day | ORAL | 1 refills | Status: DC

## 2019-04-03 DIAGNOSIS — N342 Other urethritis: Secondary | ICD-10-CM | POA: Diagnosis not present

## 2019-05-15 DIAGNOSIS — Z6826 Body mass index (BMI) 26.0-26.9, adult: Secondary | ICD-10-CM | POA: Diagnosis not present

## 2019-05-15 DIAGNOSIS — M151 Heberden's nodes (with arthropathy): Secondary | ICD-10-CM | POA: Diagnosis not present

## 2019-05-15 DIAGNOSIS — E663 Overweight: Secondary | ICD-10-CM | POA: Diagnosis not present

## 2019-05-15 DIAGNOSIS — L03012 Cellulitis of left finger: Secondary | ICD-10-CM | POA: Diagnosis not present

## 2019-05-15 DIAGNOSIS — Z1389 Encounter for screening for other disorder: Secondary | ICD-10-CM | POA: Diagnosis not present

## 2019-05-24 DIAGNOSIS — Z0001 Encounter for general adult medical examination with abnormal findings: Secondary | ICD-10-CM | POA: Diagnosis not present

## 2019-05-24 DIAGNOSIS — E039 Hypothyroidism, unspecified: Secondary | ICD-10-CM | POA: Diagnosis not present

## 2019-05-24 DIAGNOSIS — E7849 Other hyperlipidemia: Secondary | ICD-10-CM | POA: Diagnosis not present

## 2019-05-24 DIAGNOSIS — Z1389 Encounter for screening for other disorder: Secondary | ICD-10-CM | POA: Diagnosis not present

## 2019-05-24 DIAGNOSIS — E663 Overweight: Secondary | ICD-10-CM | POA: Diagnosis not present

## 2019-05-24 DIAGNOSIS — Z6826 Body mass index (BMI) 26.0-26.9, adult: Secondary | ICD-10-CM | POA: Diagnosis not present

## 2019-05-24 DIAGNOSIS — I214 Non-ST elevation (NSTEMI) myocardial infarction: Secondary | ICD-10-CM | POA: Diagnosis not present

## 2019-05-24 DIAGNOSIS — I2510 Atherosclerotic heart disease of native coronary artery without angina pectoris: Secondary | ICD-10-CM | POA: Diagnosis not present

## 2019-06-17 NOTE — Progress Notes (Signed)
Patient ID: Paige [name], female   DOB: 26-Sep-1951, 68 y.o.   MRN: [id number]     Cardiology Office Note   Date:  06/19/2019   ID:  Paige [name], Paige [name] 18-Aug-1951, MRN [id number]  PCP:  Paige Sites, MD  Cardiologist:   Paige Rouge, MD   No chief complaint on file.     History of Present Illness:  68 y.o. with IMI in [place] 01/23/15 Rx with acute BMS then had CABG with LIMA to LAD, SVG to RCA and SVG to OM EF preserved   No preceding angina dyspnea  On day of MI did have nausea and diaphoresis with chest pain but never a problem before that day  Compliant with meds  Spend winter in [place]  Usually till April  Has two daughters and two grand daughters in [place]   No angina Walks 4 miles / day    Husband also retired at home and doing well He works on her treadmill to keep it running well    Past Medical History:  Diagnosis Date  . Coronary artery disease   . Hypertension   . STEMI (ST elevation myocardial infarction) [facility])     Past Surgical History:  Procedure Laterality Date  . CARDIAC CATHETERIZATION    . COLONOSCOPY N/A 11/10/2018   Procedure: COLONOSCOPY;  Surgeon: Paige Binder, MD;  Location: [facility];  Service: Endoscopy;  Laterality: N/A;  10:30  . CORONARY ARTERY BYPASS GRAFT       Current Outpatient Medications  Medication Sig Dispense Refill  . aspirin 81 MG tablet Take 81 mg by mouth at bedtime.     Marland Kitchen atorvastatin (LIPITOR) 40 MG tablet Take 1 tablet (40 mg total) by mouth daily at 6 PM. Please make appt with Dr. Johnsie Cancel before anymore refills. 1st attempt 90 tablet 1  . Cholecalciferol (VITAMIN D3) 5000 UNITS CAPS Take 5,000 Int'l Units by mouth daily.    Marland Kitchen ibuprofen (ADVIL,MOTRIN) 200 MG tablet Take 400 mg by mouth 2 (two) times daily as needed for headache or moderate pain.    Marland Kitchen levothyroxine (SYNTHROID, LEVOTHROID) 75 MCG tablet Take 75 mcg by mouth daily before breakfast.    . metoprolol tartrate (LOPRESSOR) 25 MG  tablet Take 1 tablet (25 mg total) by mouth daily. Please make appt with Dr. Johnsie Cancel before anymore refills. 1st attempt 90 tablet 1  . Multiple Vitamin (MULTIVITAMIN) tablet Take 1 tablet by mouth daily.    . nitroGLYCERIN (NITROSTAT) 0.4 MG SL tablet Place 1 tablet (0.4 mg total) under the tongue every 5 (five) minutes as needed. (Patient taking differently: Place 0.4 mg under the tongue every 5 (five) minutes as needed for chest pain. ) 25 tablet 3  . Pseudoephedrine-Ibuprofen (ADVIL COLD/SINUS PO) Take 1 tablet by mouth daily as needed (sinuses).     No current facility-administered medications for this visit.     Allergies:   Patient has no known allergies.    Social History:  The patient  reports that she has never smoked. She has never used smokeless tobacco. She reports that she does not drink alcohol or use drugs.   Family History:  The patient's family history includes Breast cancer in her mother; Heart disease in her father.    ROS:  Please see the history of present illness.   Otherwise, review of systems are positive for none.   All other systems are reviewed and negative.    PHYSICAL EXAM: VS:  BP 128/70 (BP Location:  Left Arm)   Pulse (!) 53   Ht 5\' 3"  (1.6 m)   Wt 152 lb (68.9 kg)   SpO2 97%   BMI 26.93 kg/m  , BMI Body mass index is 26.93 kg/m. Affect appropriate Healthy:  appears stated age 64: normal Neck supple with no adenopathy JVP normal no bruits no thyromegaly Lungs clear with no wheezing and good diaphragmatic motion Heart:  S1/S2 no murmur, no rub, gallop or click PMI normal post sternotomy  Abdomen: benighn, BS positve, no tenderness, no AAA no bruit.  No HSM or HJR Distal pulses intact with no bruits No edema Neuro non-focal Skin warm and dry No muscular weakness     EKG:   05/26/18 SR rate 51 normal slightly low voltage   Recent Labs: No results found for requested labs within last 8760 hours.    Lipid Panel    Component Value  Date/Time   CHOL 158 03/11/2017 0833   TRIG 64 03/11/2017 0833   HDL 54 03/11/2017 0833   CHOLHDL 2.9 03/11/2017 0833   LDLCALC 91 03/11/2017 0833      Wt Readings from Last 3 Encounters:  06/19/19 152 lb (68.9 kg)  11/10/18 146 lb (66.2 kg)  05/26/18 148 lb 8 oz (67.4 kg)      Other studies Reviewed: Additional studies/ records that were reviewed today include: [facility] hospital CATH and CABG reports Echo and d/c summary see HPI.    ASSESSMENT AND PLAN:  1.  CAD:  01/2015 with IMI preserved EF  Rx BMS and staged CABG.   Continue ASA and beta blocker    2.  Chol  lipitor 40 mg f/u labs Dr Paige [name]  3.  Thyroid:  On replacement f/u labs with primary    Current medicines are reviewed at length with the patient today.  The patient does not have concerns regarding medicines.  The following changes have been made:     Labs/ tests ordered today include:  None    No orders of the defined types were placed in this encounter.    Disposition:   FU with me in a year      Signed, Paige Rouge, MD  06/19/2019 9:16 AM    [facility] [address], [place], [place]  [address] Phone: [phone]; Fax: [phone]

## 2019-06-19 ENCOUNTER — Other Ambulatory Visit: Payer: Self-pay

## 2019-06-19 ENCOUNTER — Ambulatory Visit (INDEPENDENT_AMBULATORY_CARE_PROVIDER_SITE_OTHER): Payer: Medicare Other | Admitting: Cardiovascular Disease

## 2019-06-19 ENCOUNTER — Encounter: Payer: Self-pay | Admitting: Cardiovascular Disease

## 2019-06-19 VITALS — BP 128/70 | HR 53 | Ht 63.0 in | Wt 152.0 lb

## 2019-06-19 DIAGNOSIS — Z951 Presence of aortocoronary bypass graft: Secondary | ICD-10-CM | POA: Diagnosis not present

## 2019-06-19 NOTE — Patient Instructions (Signed)

## 2019-08-08 DIAGNOSIS — Z23 Encounter for immunization: Secondary | ICD-10-CM | POA: Diagnosis not present

## 2019-09-14 ENCOUNTER — Other Ambulatory Visit: Payer: Self-pay | Admitting: Cardiovascular Disease

## 2019-09-17 NOTE — Telephone Encounter (Signed)
This is a  pt.  °

## 2019-10-15 DIAGNOSIS — Z6826 Body mass index (BMI) 26.0-26.9, adult: Secondary | ICD-10-CM | POA: Diagnosis not present

## 2019-10-15 DIAGNOSIS — Z124 Encounter for screening for malignant neoplasm of cervix: Secondary | ICD-10-CM | POA: Diagnosis not present

## 2019-10-15 DIAGNOSIS — Z01419 Encounter for gynecological examination (general) (routine) without abnormal findings: Secondary | ICD-10-CM | POA: Diagnosis not present

## 2019-11-05 DIAGNOSIS — I10 Essential (primary) hypertension: Secondary | ICD-10-CM | POA: Diagnosis not present

## 2019-11-05 DIAGNOSIS — I2510 Atherosclerotic heart disease of native coronary artery without angina pectoris: Secondary | ICD-10-CM | POA: Diagnosis not present

## 2019-11-05 DIAGNOSIS — E7849 Other hyperlipidemia: Secondary | ICD-10-CM | POA: Diagnosis not present

## 2019-12-06 DIAGNOSIS — I10 Essential (primary) hypertension: Secondary | ICD-10-CM | POA: Diagnosis not present

## 2019-12-06 DIAGNOSIS — E7849 Other hyperlipidemia: Secondary | ICD-10-CM | POA: Diagnosis not present

## 2019-12-06 DIAGNOSIS — E039 Hypothyroidism, unspecified: Secondary | ICD-10-CM | POA: Diagnosis not present

## 2019-12-06 DIAGNOSIS — I2510 Atherosclerotic heart disease of native coronary artery without angina pectoris: Secondary | ICD-10-CM | POA: Diagnosis not present

## 2020-01-07 DIAGNOSIS — Z20828 Contact with and (suspected) exposure to other viral communicable diseases: Secondary | ICD-10-CM | POA: Diagnosis not present

## 2020-01-24 DIAGNOSIS — Z23 Encounter for immunization: Secondary | ICD-10-CM | POA: Diagnosis not present

## 2020-02-21 DIAGNOSIS — Z23 Encounter for immunization: Secondary | ICD-10-CM | POA: Diagnosis not present

## 2020-02-22 ENCOUNTER — Other Ambulatory Visit: Payer: Self-pay | Admitting: Cardiovascular Disease

## 2020-04-04 DIAGNOSIS — E7849 Other hyperlipidemia: Secondary | ICD-10-CM | POA: Diagnosis not present

## 2020-04-04 DIAGNOSIS — N1832 Chronic kidney disease, stage 3b: Secondary | ICD-10-CM | POA: Diagnosis not present

## 2020-04-04 DIAGNOSIS — E039 Hypothyroidism, unspecified: Secondary | ICD-10-CM | POA: Diagnosis not present

## 2020-04-04 DIAGNOSIS — I2510 Atherosclerotic heart disease of native coronary artery without angina pectoris: Secondary | ICD-10-CM | POA: Diagnosis not present

## 2020-05-16 ENCOUNTER — Other Ambulatory Visit: Payer: Self-pay | Admitting: Cardiovascular Disease

## 2020-05-20 ENCOUNTER — Other Ambulatory Visit: Payer: Self-pay

## 2020-05-20 ENCOUNTER — Encounter: Payer: Self-pay | Admitting: Dermatology

## 2020-05-20 ENCOUNTER — Ambulatory Visit (INDEPENDENT_AMBULATORY_CARE_PROVIDER_SITE_OTHER): Payer: Medicare Other | Admitting: Dermatology

## 2020-05-20 DIAGNOSIS — I2510 Atherosclerotic heart disease of native coronary artery without angina pectoris: Secondary | ICD-10-CM

## 2020-05-20 DIAGNOSIS — I2583 Coronary atherosclerosis due to lipid rich plaque: Secondary | ICD-10-CM | POA: Diagnosis not present

## 2020-05-20 DIAGNOSIS — L910 Hypertrophic scar: Secondary | ICD-10-CM

## 2020-05-20 DIAGNOSIS — D1801 Hemangioma of skin and subcutaneous tissue: Secondary | ICD-10-CM

## 2020-05-20 MED ORDER — TRIAMCINOLONE ACETONIDE 10 MG/ML IJ SUSP
40.0000 mg | Freq: Once | INTRAMUSCULAR | Status: AC
  Administered 2020-05-20: 40 mg

## 2020-05-20 NOTE — Progress Notes (Signed)
   Follow-Up Visit   Subjective  Paige [name] is a 69 y.o. female who presents for the following: Annual Exam (no new conerns, chest scar is very tender).  Postop keloid Location: Chest Duration:  Quality:  Associated Signs/Symptoms: Tender Modifying Factors:  Severity:  Timing: Context:   The following portions of the chart were reviewed this encounter and updated as appropriate: Tobacco  Allergies  Meds  Problems  Med Hx  Surg Hx  Fam Hx      Objective  Well appearing patient in no apparent distress; mood and affect are within normal limits.  All sun exposed areas plus back examined. Plus legs and chest.   Assessment & Plan   Tender keloid chest injected with 40mg /cc triamcinolone. No atypical moles.  Persistent pain particularly with rubbing of the surgical keloid over the mid to lower sternum on face has path.  Injected today with 40 mg/cc triamcinolone 1 cc total.  I I told Paige [name] that this will almost certainly require 4 to 6 injections which could be continued even after she returns to [place].  Roughly 60-70% of people will see reduction of discomfort, flattening, and/or softening of the keloid.  The rest of her exam from legs to scalp was normal.  On her left shin is a longstanding stable keratosis which does not require intervention unless there is obvious clinical change.  On the inner back left thigh is a very dark lesion which showed with dermoscopy to be benign angioma.  There were no other areas of current concern to Paige [name].  She knows to sun protect during her many days in [place].  All questions were addressed.

## 2020-05-22 ENCOUNTER — Other Ambulatory Visit: Payer: Self-pay

## 2020-05-22 ENCOUNTER — Ambulatory Visit (INDEPENDENT_AMBULATORY_CARE_PROVIDER_SITE_OTHER): Payer: Medicare Other | Admitting: Cardiovascular Disease

## 2020-05-22 VITALS — BP 130/76 | Ht 63.0 in | Wt 150.0 lb

## 2020-05-22 DIAGNOSIS — Z951 Presence of aortocoronary bypass graft: Secondary | ICD-10-CM

## 2020-05-22 DIAGNOSIS — I2510 Atherosclerotic heart disease of native coronary artery without angina pectoris: Secondary | ICD-10-CM

## 2020-05-22 DIAGNOSIS — I2583 Coronary atherosclerosis due to lipid rich plaque: Secondary | ICD-10-CM | POA: Diagnosis not present

## 2020-05-22 MED ORDER — NITROGLYCERIN 0.4 MG SL SUBL: 0.4000 mg | SUBLINGUAL_TABLET | SUBLINGUAL | 3 refills | Status: DC | PRN

## 2020-05-22 NOTE — Progress Notes (Signed)
Patient ID: MURIEL HANNOLD, female   DOB: 1950/12/12, 69 y.o.   MRN: [id number]     Cardiology Office Note   Date:  05/22/2020   ID:  Tanicia, Wolaver [birth year], MRN [id number]  PCP:  Sharilyn Sites, MD  Cardiologist:   Jenkins Rouge, MD   No chief complaint on file.     History of Present Illness:  69 y.o. with IMI in [place] 01/23/15 Rx with acute BMS then had CABG with LIMA to LAD, SVG to RCA and SVG to OM EF preserved   No preceding angina dyspnea  On day of MI did have nausea and diaphoresis with chest pain but never a problem before that day  Compliant with meds  Spend winter in [place]  Usually till April  Has two daughters and two grand daughters in [place]   No angina Walks 4 miles / day    Husband also retired at home and doing well He works on her treadmill to keep it running well  Has had vaccine and seeing Dr Hilma Favors for primary care issues   Past Medical History:  Diagnosis Date  . Coronary artery disease   . Hypertension   . STEMI (ST elevation myocardial infarction) [facility])     Past Surgical History:  Procedure Laterality Date  . CARDIAC CATHETERIZATION    . COLONOSCOPY N/A 11/10/2018   Procedure: COLONOSCOPY;  Surgeon: Danie Binder, MD;  [facility];  Service: Endoscopy;  Laterality: N/A;  10:30  . CORONARY ARTERY BYPASS GRAFT       Current Outpatient Medications  Medication Sig Dispense Refill  . aspirin 81 MG tablet Take 81 mg by mouth at bedtime.     Marland Kitchen atorvastatin (LIPITOR) 40 MG tablet TAKE 1 TABLET DAILY AT 6PM 90 tablet 1  . Cholecalciferol (VITAMIN D3) 5000 UNITS CAPS Take 5,000 Int'l Units by mouth daily.    Marland Kitchen ibuprofen (ADVIL,MOTRIN) 200 MG tablet Take 400 mg by mouth 2 (two) times daily as needed for headache or moderate pain.    Marland Kitchen levothyroxine (SYNTHROID, LEVOTHROID) 75 MCG tablet Take 75 mcg by mouth daily before breakfast.    . metoprolol tartrate (LOPRESSOR) 25 MG tablet TAKE 1 TABLET DAILY 90 tablet  1  . Multiple Vitamin (MULTIVITAMIN) tablet Take 1 tablet by mouth daily.    . nitroGLYCERIN (NITROSTAT) 0.4 MG SL tablet Place 1 tablet (0.4 mg total) under the tongue every 5 (five) minutes as needed. (Patient taking differently: Place 0.4 mg under the tongue every 5 (five) minutes as needed for chest pain. ) 25 tablet 3  . Pseudoephedrine-Ibuprofen (ADVIL COLD/SINUS PO) Take 1 tablet by mouth daily as needed (sinuses).     No current facility-administered medications for this visit.    Allergies:   Patient has no known allergies.    Social History:  The patient  reports that she has never smoked. She has never used smokeless tobacco. She reports that she does not drink alcohol and does not use drugs.   Family History:  The patient's family history includes Breast cancer in her mother; Heart disease in her father.    ROS:  Please see the history of present illness.   Otherwise, review of systems are positive for none.   All other systems are reviewed and negative.    PHYSICAL EXAM: VS:  Ht 5\' 3"  (1.6 m)   SpO2 95%   BMI 26.93 kg/m  , BMI Body mass index is 26.93 kg/m. Affect appropriate  Healthy:  appears stated age 50: normal Neck supple with no adenopathy JVP normal no bruits no thyromegaly Lungs clear with no wheezing and good diaphragmatic motion Heart:  S1/S2 no murmur, no rub, gallop or click PMI normal post sternotomy  Abdomen: benighn, BS positve, no tenderness, no AAA no bruit.  No HSM or HJR Distal pulses intact with no bruits No edema Neuro non-focal Skin warm and dry No muscular weakness     EKG:   05/26/18 SR rate 51 normal slightly low voltage   Recent Labs: No results found for requested labs within last 8760 hours.    Lipid Panel    Component Value Date/Time   CHOL 158 03/11/2017 0833   TRIG 64 03/11/2017 0833   HDL 54 03/11/2017 0833   CHOLHDL 2.9 03/11/2017 0833   LDLCALC 91 03/11/2017 0833      Wt Readings from Last 3 Encounters:    06/19/19 152 lb (68.9 kg)  11/10/18 146 lb (66.2 kg)  05/26/18 148 lb 8 oz (67.4 kg)      Other studies Reviewed: Additional studies/ records that were reviewed today include: [facility] hospital CATH and CABG reports Echo and d/c summary see HPI.    ASSESSMENT AND PLAN:  1.  CAD:  01/2015 with IMI preserved EF  Rx BMS and staged CABG.   Continue ASA and beta blocker   New SL nitro called in 2.  Chol  lipitor 40 mg f/u labs Dr Hilma Favors  3.  Thyroid:  On replacement f/u labs with primary    Current medicines are reviewed at length with the patient today.  The patient does not have concerns regarding medicines.  The following changes have been made:     Labs/ tests ordered today include:  None    Orders Placed This Encounter  Procedures  . EKG 12-Lead     Disposition:   FU with me in a year      Signed, Jenkins Rouge, MD  05/22/2020 10:48 AM    [facility] [place], [place], [place]  [address] Phone: [phone]; Fax: [phone]

## 2020-05-22 NOTE — Patient Instructions (Signed)
Medication Instructions:  Your physician recommends that you continue on your current medications as directed. Please refer to the Current Medication list given to you today.  *If you need a refill on your cardiac medications before your next appointment, please call your pharmacy*   Lab Work: None today If you have labs (blood work) drawn today and your tests are completely normal, you will receive your results only by: . MyChart Message (if you have MyChart) OR . A paper copy in the mail If you have any lab test that is abnormal or we need to change your treatment, we will call you to review the results.   Testing/Procedures: None today   Follow-Up: At [facility], you and your health needs are our priority.  As part of our continuing mission to provide you with exceptional heart care, we have created designated Provider Care Teams.  These Care Teams include your primary Cardiologist (physician) and Advanced Practice Providers (APPs -  Physician Assistants and Nurse Practitioners) who all work together to provide you with the care you need, when you need it.  We recommend signing up for the patient portal called "MyChart".  Sign up information is provided on this After Visit Summary.  MyChart is used to connect with patients for Virtual Visits (Telemedicine).  Patients are able to view lab/test results, encounter notes, upcoming appointments, etc.  Non-urgent messages can be sent to your provider as well.   To learn more about what you can do with MyChart, go to [email or web address].    Your next appointment:   12 month(s)  The format for your next appointment:   In Person  Provider:   Peter Nishan, MD   Other Instructions None      Thank you for choosing [facility] !         

## 2020-06-21 ENCOUNTER — Encounter: Payer: Self-pay | Admitting: Dermatology

## 2020-06-21 MED ORDER — TRIAMCINOLONE ACETONIDE 40 MG/ML IJ SUSP: 40.0000 mg | Freq: Once | INTRAMUSCULAR | Status: AC

## 2020-06-25 ENCOUNTER — Ambulatory Visit: Payer: Medicare Other | Admitting: Cardiovascular Disease

## 2020-06-26 ENCOUNTER — Telehealth: Payer: Self-pay

## 2020-06-26 DIAGNOSIS — E7800 Pure hypercholesterolemia, unspecified: Secondary | ICD-10-CM

## 2020-06-26 MED ORDER — EZETIMIBE 10 MG PO TABS: 10.0000 mg | ORAL_TABLET | Freq: Every day | ORAL | 3 refills | Status: DC

## 2020-06-26 NOTE — Telephone Encounter (Signed)
Called patient with Dr. Johnsie Cancel recommendations. Patient stated she is taking Lipitor daily, so will add zetia 10 mg by mouth daily. Patient will come in on 10/02/20 for lab work.

## 2020-06-26 NOTE — Telephone Encounter (Signed)
-----   Message from Josue Hector, MD sent at 06/25/2020 12:34 PM EDT ----- LDL 98 above goal for CAD if she is taking her lipitor regularly add zetia 10 mg daily and f/u labs in 3 months

## 2020-07-01 NOTE — Telephone Encounter (Signed)
See Mychart message. Patient is not going to take zetia for now. Will keep appt in October for repeat lab work.

## 2020-07-02 ENCOUNTER — Ambulatory Visit (INDEPENDENT_AMBULATORY_CARE_PROVIDER_SITE_OTHER): Payer: Medicare Other | Admitting: Dermatology

## 2020-07-02 ENCOUNTER — Other Ambulatory Visit: Payer: Self-pay

## 2020-07-02 DIAGNOSIS — L910 Hypertrophic scar: Secondary | ICD-10-CM | POA: Diagnosis not present

## 2020-07-02 DIAGNOSIS — I2583 Coronary atherosclerosis due to lipid rich plaque: Secondary | ICD-10-CM | POA: Diagnosis not present

## 2020-07-02 DIAGNOSIS — I2510 Atherosclerotic heart disease of native coronary artery without angina pectoris: Secondary | ICD-10-CM | POA: Diagnosis not present

## 2020-07-04 DIAGNOSIS — E039 Hypothyroidism, unspecified: Secondary | ICD-10-CM | POA: Diagnosis not present

## 2020-07-04 DIAGNOSIS — I10 Essential (primary) hypertension: Secondary | ICD-10-CM | POA: Diagnosis not present

## 2020-07-04 DIAGNOSIS — I2510 Atherosclerotic heart disease of native coronary artery without angina pectoris: Secondary | ICD-10-CM | POA: Diagnosis not present

## 2020-07-04 DIAGNOSIS — E7849 Other hyperlipidemia: Secondary | ICD-10-CM | POA: Diagnosis not present

## 2020-08-11 ENCOUNTER — Encounter: Payer: Self-pay | Admitting: Dermatology

## 2020-08-11 NOTE — Progress Notes (Signed)
   Follow-Up Visit   Subjective  Paige [name] is a 69 y.o. female who presents for the following: Follow-up (Pt stated--the scar is looking much better and plat down but still tender.).  Keloid Location: Chest Duration:  Quality:  Associated Signs/Symptoms: Tender Modifying Factors: Previous injection helped it  to flatten and be softer. Severity:  Timing: Context:   Objective  Well appearing patient in no apparent distress; mood and affect are within normal limits.  A focused examination was performed including Chest.. Relevant physical exam findings are noted in the Assessment and Plan.   Assessment & Plan    Keloid Chest - Medial [facility])  Improved after Triamcinolone injections.  Repeat today  triamcinolone acetonide (KENALOG-40) injection 40 mg - Chest - Medial (Center)  Intralesional injection - Chest - Medial [facility]) Location: mid chest, abdomen  Informed Consent: Discussed risks (infection, pain, bleeding, bruising, thinning of the skin, loss of skin pigment, lack of resolution, and recurrence of lesion) and benefits of the procedure, as well as the alternatives. Informed consent was obtained. Preparation: The area was prepared a standard fashion.  Anesthesia: None  Procedure Details: An intralesional injection was performed with Kenalog 40 mg/cc. 0.5 cc in total were injected.  Total number of injections: 6  Plan: The patient was instructed on post-op care. Recommend OTC analgesia as needed for pain.       I, Lavonna Monarch, MD, have reviewed all documentation for this visit.  The documentation on 08/11/20 for the exam, diagnosis, procedures, and orders are all accurate and complete.

## 2020-09-04 DIAGNOSIS — E7849 Other hyperlipidemia: Secondary | ICD-10-CM | POA: Diagnosis not present

## 2020-09-04 DIAGNOSIS — I2510 Atherosclerotic heart disease of native coronary artery without angina pectoris: Secondary | ICD-10-CM | POA: Diagnosis not present

## 2020-09-04 DIAGNOSIS — I10 Essential (primary) hypertension: Secondary | ICD-10-CM | POA: Diagnosis not present

## 2020-09-04 DIAGNOSIS — E039 Hypothyroidism, unspecified: Secondary | ICD-10-CM | POA: Diagnosis not present

## 2020-09-23 ENCOUNTER — Other Ambulatory Visit (HOSPITAL_COMMUNITY)
Admission: RE | Admit: 2020-09-23 | Discharge: 2020-09-23 | Disposition: A | Discharge status: Home / Self Care | Payer: Medicare Other | Source: Ambulatory Visit | Attending: Cardiovascular Disease | Admitting: Cardiovascular Disease

## 2020-09-23 DIAGNOSIS — E7800 Pure hypercholesterolemia, unspecified: Secondary | ICD-10-CM | POA: Diagnosis not present

## 2020-09-23 LAB — LIPID PANEL
Cholesterol: 148 mg/dL (ref 0–200)
HDL: 53 mg/dL (ref >40)
LDL Cholesterol: 78 mg/dL (ref 0–99)
Total CHOL/HDL Ratio: 2.8 RATIO
Triglycerides: 84 mg/dL (ref <150)
VLDL: 17 mg/dL (ref 0–40)

## 2020-09-23 LAB — HEPATIC FUNCTION PANEL
ALT: 70 U/L — ABNORMAL HIGH (ref 0–44)
AST: 44 U/L — ABNORMAL HIGH (ref 15–41)
Albumin: 4.2 g/dL (ref 3.5–5.0)
Alkaline Phosphatase: 74 U/L (ref 38–126)
Bilirubin, Direct: 0.1 mg/dL (ref 0.0–0.2)
Indirect Bilirubin: 0.7 mg/dL (ref 0.3–0.9)
Total Bilirubin: 0.8 mg/dL (ref 0.3–1.2)
Total Protein: 7.2 g/dL (ref 6.5–8.1)

## 2020-09-25 ENCOUNTER — Telehealth: Payer: Self-pay

## 2020-09-25 DIAGNOSIS — E7800 Pure hypercholesterolemia, unspecified: Secondary | ICD-10-CM

## 2020-09-25 NOTE — Telephone Encounter (Signed)
-----   Message from Josue Hector, MD sent at 09/23/2020 10:51 AM EDT ----- LDL close enough to goal in 70's LFT;s elevated ? Is she taking zetia or just statin f/u labs in 8 weeks in lipid clinic to follow

## 2020-09-25 NOTE — Telephone Encounter (Signed)
The patient has been notified of the result and verbalized understanding.  All questions (if any) were answered. Michaelyn Barter, RN 09/25/2020 1:50 PM   Patient stated she is not taking the zetia, she wanted to try diet to improve first. Patient stated she will be going to [place] for several months in starting in the middle of December. Made patient an appointment with Pharm D on 11/13/20. Patient stated her PCP will be getting lab work from her before she leaves for [place], so she will get him to do a repeat lipid and liver panel.

## 2020-10-02 ENCOUNTER — Other Ambulatory Visit: Payer: Medicare Other

## 2020-10-04 DIAGNOSIS — I2510 Atherosclerotic heart disease of native coronary artery without angina pectoris: Secondary | ICD-10-CM | POA: Diagnosis not present

## 2020-10-04 DIAGNOSIS — E7849 Other hyperlipidemia: Secondary | ICD-10-CM | POA: Diagnosis not present

## 2020-10-04 DIAGNOSIS — I10 Essential (primary) hypertension: Secondary | ICD-10-CM | POA: Diagnosis not present

## 2020-10-31 DIAGNOSIS — Z23 Encounter for immunization: Secondary | ICD-10-CM | POA: Diagnosis not present

## 2020-11-06 DIAGNOSIS — E7849 Other hyperlipidemia: Secondary | ICD-10-CM | POA: Diagnosis not present

## 2020-11-06 DIAGNOSIS — Z23 Encounter for immunization: Secondary | ICD-10-CM | POA: Diagnosis not present

## 2020-11-06 DIAGNOSIS — Z959 Presence of cardiac and vascular implant and graft, unspecified: Secondary | ICD-10-CM | POA: Diagnosis not present

## 2020-11-06 DIAGNOSIS — I2510 Atherosclerotic heart disease of native coronary artery without angina pectoris: Secondary | ICD-10-CM | POA: Diagnosis not present

## 2020-11-06 DIAGNOSIS — Z6826 Body mass index (BMI) 26.0-26.9, adult: Secondary | ICD-10-CM | POA: Diagnosis not present

## 2020-11-06 DIAGNOSIS — R7309 Other abnormal glucose: Secondary | ICD-10-CM | POA: Diagnosis not present

## 2020-11-06 DIAGNOSIS — E039 Hypothyroidism, unspecified: Secondary | ICD-10-CM | POA: Diagnosis not present

## 2020-11-13 ENCOUNTER — Other Ambulatory Visit: Payer: Self-pay

## 2020-11-13 ENCOUNTER — Ambulatory Visit (INDEPENDENT_AMBULATORY_CARE_PROVIDER_SITE_OTHER): Payer: Medicare Other | Admitting: Pharmacist

## 2020-11-13 DIAGNOSIS — I2510 Atherosclerotic heart disease of native coronary artery without angina pectoris: Secondary | ICD-10-CM | POA: Diagnosis not present

## 2020-11-13 DIAGNOSIS — E785 Hyperlipidemia, unspecified: Secondary | ICD-10-CM | POA: Insufficient documentation

## 2020-11-13 DIAGNOSIS — E782 Mixed hyperlipidemia: Secondary | ICD-10-CM | POA: Diagnosis not present

## 2020-11-13 DIAGNOSIS — I2583 Coronary atherosclerosis due to lipid rich plaque: Secondary | ICD-10-CM

## 2020-11-13 MED ORDER — ROSUVASTATIN CALCIUM 40 MG PO TABS: 40.0000 mg | ORAL_TABLET | Freq: Every day | ORAL | 3 refills | Status: DC

## 2020-11-13 MED ORDER — METOPROLOL SUCCINATE ER 25 MG PO TB24: 25.0000 mg | ORAL_TABLET | Freq: Every day | ORAL | 3 refills | Status: DC

## 2020-11-13 NOTE — Progress Notes (Signed)
Patient ID: Paige [name]                 DOB: 27-Sep-1951                    MRN: [id number]     HPI: Paige [name] is a 69 y.o. female patient referred to lipid clinic by Dr Johnsie Cancel. PMH is significant for MI in 2016 s/p BMS then CABG with LIMA to LAD, SVG to RCA, and SVG to OM, HTN, and hypothyroidism. LDL was 98 on atorvastatin 40mg  daily. It was recommended for pt to start ezetimibe, however she wanted to work on her keto diet first. Follow up lipid panel did show LDL improved to 78 however still above goal.  Pt presents today in good spirits with her husband. Has been married for 52 years. Her PCP did recheck labs last week, could not access them in Care Everywhere or [facility] but pt brought a copy today: 11/06/20: TC 130, TG 78, HDL 55, LDL 60, ALT 56, AST 38.  No issues tolerating her atorvastatin. Denies alcohol and Tylenol use. No other OTC products that could be raising LFTs. ALT previously normal in 2020 on same statin. Did start taking OTC fish oil recently.  Pt is going to her home in [place] for the winter, leaving at the end of this month.   Current Medications: atorvastatin 40mg  daily Risk Factors: CAD s/p MI and CABG in 2016 in [place] LDL goal: 70mg /dL  Diet: Cut back on trans fats, carbs and sweets. Not following keto diet anymore. Eating lean meats like chicken. Follows Weight Watchers now. Limiting red meat. Avoiding cheese unless fat free.   Exercise: Walks 4-5 miles every day. Goes to [place] for the winter through May.   Family History: The patient's family history includes Breast cancer in her mother; Heart disease in her father (died in his 62s).  Social History: The patient  reports that she has never smoked. She has never used smokeless tobacco. She reports that she does not drink alcohol and does not use drugs.   Labs: 11/06/20: TC 130, TG 78, HDL 55, LDL 60, ALT 56, AST 38 (atorvastatin 40mg  daily) 09/23/20: TC 148,  TG 84, HDL 53, LDL 78, ALT 70, AST 44  (atorvastatin 40mg  daily) 06/03/19: TC 173, TG 57, HDL 64, LDL 98, ALT 31, AST 34 (atorvastatin 40mg  daily)  Past Medical History:  Diagnosis Date  . Coronary artery disease   . Hypertension   . STEMI (ST elevation myocardial infarction) [facility])     Current Outpatient Medications on File Prior to Visit  Medication Sig Dispense Refill  . aspirin 81 MG tablet Take 81 mg by mouth at bedtime.     Marland Kitchen atorvastatin (LIPITOR) 40 MG tablet TAKE 1 TABLET DAILY AT 6PM 90 tablet 1  . Cholecalciferol (VITAMIN D3) 5000 UNITS CAPS Take 5,000 Int'l Units by mouth daily.    Marland Kitchen ezetimibe (ZETIA) 10 MG tablet Take 1 tablet (10 mg total) by mouth daily. 90 tablet 3  . ibuprofen (ADVIL,MOTRIN) 200 MG tablet Take 400 mg by mouth 2 (two) times daily as needed for headache or moderate pain.    Marland Kitchen levothyroxine (SYNTHROID, LEVOTHROID) 75 MCG tablet Take 75 mcg by mouth daily before breakfast.    . metoprolol tartrate (LOPRESSOR) 25 MG tablet TAKE 1 TABLET DAILY 90 tablet 1  . Multiple Vitamin (MULTIVITAMIN) tablet Take 1 tablet by mouth daily.    . nitroGLYCERIN (NITROSTAT) 0.4 MG SL tablet  Place 1 tablet (0.4 mg total) under the tongue every 5 (five) minutes as needed for chest pain. 25 tablet 3  . Pseudoephedrine-Ibuprofen (ADVIL COLD/SINUS PO) Take 1 tablet by mouth daily as needed (sinuses).     Current Facility-Administered Medications on File Prior to Visit  Medication Dose Route Frequency Provider Last Rate Last Admin  . triamcinolone acetonide (KENALOG-40) injection 40 mg  40 mg Intramuscular Once Lavonna Monarch, MD        No Known Allergies  Assessment/Plan:  1. Hyperlipidemia - LDL is at goal < 70, has improved from 78 in October to 60 on atorvastatin 40mg  daily secondary to dietary improvements. ALT remains mildly elevated, denies APAP and alcohol use. Will stop atorvastatin 40mg  daily and start rosuvastatin 40mg  daily as this is less likely to cause LFT elevations. Pt will have advanced lipid panel and  LFTs rechecked by her PCP next June when she is back in [place].  [name] E. Dayvian Blixt, PharmD, BCACP, [place] [address], [place],  [address] Phone: [phone]; Fax: [phone] 11/13/2020 9:22 AM

## 2020-11-13 NOTE — Patient Instructions (Addendum)
It was nice to meet you today!  Your LDL goal is < 70  Stop taking atorvastatin (Lipitor), start taking rosuvastatin (Crestor) 40mg  once daily  Stop taking your fish oil  I changed the formulation of your metoprolol from tartrate (short acting) to succinate (long acting). Still take 1 tablet once a day, this will provide you better 24 hour coverage  Have your primary care doctor recheck your lab work next June - advanced lipid panel (including Apolipoprotein B, Lipoprotein a, and LDL particle #) and liver enzymes

## 2020-11-20 DIAGNOSIS — Z1231 Encounter for screening mammogram for malignant neoplasm of breast: Secondary | ICD-10-CM | POA: Diagnosis not present

## 2020-12-05 DIAGNOSIS — I2510 Atherosclerotic heart disease of native coronary artery without angina pectoris: Secondary | ICD-10-CM | POA: Diagnosis not present

## 2020-12-05 DIAGNOSIS — E7849 Other hyperlipidemia: Secondary | ICD-10-CM | POA: Diagnosis not present

## 2020-12-05 DIAGNOSIS — I10 Essential (primary) hypertension: Secondary | ICD-10-CM | POA: Diagnosis not present

## 2021-03-04 DIAGNOSIS — E7849 Other hyperlipidemia: Secondary | ICD-10-CM | POA: Diagnosis not present

## 2021-03-04 DIAGNOSIS — I10 Essential (primary) hypertension: Secondary | ICD-10-CM | POA: Diagnosis not present

## 2021-03-04 DIAGNOSIS — I2510 Atherosclerotic heart disease of native coronary artery without angina pectoris: Secondary | ICD-10-CM | POA: Diagnosis not present

## 2021-04-04 DIAGNOSIS — I10 Essential (primary) hypertension: Secondary | ICD-10-CM | POA: Diagnosis not present

## 2021-04-04 DIAGNOSIS — E7849 Other hyperlipidemia: Secondary | ICD-10-CM | POA: Diagnosis not present

## 2021-04-04 DIAGNOSIS — I2510 Atherosclerotic heart disease of native coronary artery without angina pectoris: Secondary | ICD-10-CM | POA: Diagnosis not present

## 2021-05-05 DIAGNOSIS — I2510 Atherosclerotic heart disease of native coronary artery without angina pectoris: Secondary | ICD-10-CM | POA: Diagnosis not present

## 2021-05-05 DIAGNOSIS — I10 Essential (primary) hypertension: Secondary | ICD-10-CM | POA: Diagnosis not present

## 2021-05-05 DIAGNOSIS — E7849 Other hyperlipidemia: Secondary | ICD-10-CM | POA: Diagnosis not present

## 2021-05-07 DIAGNOSIS — E663 Overweight: Secondary | ICD-10-CM | POA: Diagnosis not present

## 2021-05-07 DIAGNOSIS — N342 Other urethritis: Secondary | ICD-10-CM | POA: Diagnosis not present

## 2021-05-07 DIAGNOSIS — Z1331 Encounter for screening for depression: Secondary | ICD-10-CM | POA: Diagnosis not present

## 2021-05-07 DIAGNOSIS — Z6827 Body mass index (BMI) 27.0-27.9, adult: Secondary | ICD-10-CM | POA: Diagnosis not present

## 2021-05-20 ENCOUNTER — Ambulatory Visit: Payer: Medicare Other | Admitting: Dermatology

## 2021-05-27 ENCOUNTER — Ambulatory Visit: Payer: Medicare Other | Admitting: Cardiovascular Disease

## 2021-05-27 NOTE — Progress Notes (Signed)
No Patient ID: Paige [name], female   DOB: 04/19/1951, 70 y.o.   MRN: [id number]     Cardiology Office Note   Date:  06/09/2021   ID:  Paige [name], Paige [name] [birth year], MRN [id number]  PCP:  Sharilyn Sites, MD  Cardiologist:   Jenkins Rouge, MD   No chief complaint on file.     History of Present Illness:  70 y.o. with IMI in [place] 01/23/15 Rx with acute BMS then had CABG with LIMA to LAD, SVG to RCA and SVG to OM EF preserved  No preceding angina dyspnea  On day of MI did have nausea and diaphoresis with chest pain but never a problem before that day  Spend winter in [place]  Usually till April  Has two daughters and two grand daughters in [place]   No angina Walks 4 miles / day    Husband also retired at home and doing well He works on her treadmill to keep it running well  Has had vaccine and seeing Dr Hilma Favors for primary care issues   Seen in lipid clinic December 2021 Lipitor changed to crestor for mild elevation in LFTls LDL at goal 60   No chest pain compliant with diet and meds A1c a bit high at 6.0   Camping a lot and going to [facility]  Past Medical History:  Diagnosis Date   Coronary artery disease    Hypertension    STEMI (ST elevation myocardial infarction) [facility])     Past Surgical History:  Procedure Laterality Date   CARDIAC CATHETERIZATION     COLONOSCOPY N/A 11/10/2018   Procedure: COLONOSCOPY;  Surgeon: Danie Binder, MD;  Location[facility];  Service: Endoscopy;  Laterality: N/A;  10:30   CORONARY ARTERY BYPASS GRAFT       Current Outpatient Medications  Medication Sig Dispense Refill   aspirin 81 MG tablet Take 81 mg by mouth at bedtime.      Cholecalciferol (VITAMIN D3) 5000 UNITS CAPS Take 5,000 Int'l Units by mouth daily.     ibuprofen (ADVIL,MOTRIN) 200 MG tablet Take 400 mg by mouth 2 (two) times daily as needed for headache or moderate pain.     levothyroxine (SYNTHROID, LEVOTHROID) 75 MCG tablet Take 75 mcg by  mouth daily before breakfast.     metoprolol succinate (TOPROL XL) 25 MG 24 hr tablet Take 1 tablet (25 mg total) by mouth daily. 90 tablet 3   Multiple Vitamin (MULTIVITAMIN) tablet Take 1 tablet by mouth daily.     nitroGLYCERIN (NITROSTAT) 0.4 MG SL tablet Place 1 tablet (0.4 mg total) under the tongue every 5 (five) minutes as needed for chest pain. 25 tablet 3   rosuvastatin (CRESTOR) 40 MG tablet Take 1 tablet (40 mg total) by mouth daily. 90 tablet 3   zinc gluconate 50 MG tablet Take 50 mg by mouth daily.     Current Facility-Administered Medications  Medication Dose Route Frequency Provider Last Rate Last Admin   triamcinolone acetonide (KENALOG-40) injection 40 mg  40 mg Intramuscular Once Lavonna Monarch, MD        Allergies:   Patient has no known allergies.    Social History:  The patient  reports that she has never smoked. She has never used smokeless tobacco. She reports that she does not drink alcohol and does not use drugs.   Family History:  The patient's family history includes Breast cancer in her mother; Heart disease in her father.  ROS:  Please see the history of present illness.   Otherwise, review of systems are positive for none.   All other systems are reviewed and negative.    PHYSICAL EXAM: VS:  There were no vitals taken for this visit. , BMI There is no height or weight on file to calculate BMI. Affect appropriate Healthy:  appears stated age 61: normal Neck supple with no adenopathy JVP normal no bruits no thyromegaly Lungs clear with no wheezing and good diaphragmatic motion Heart:  S1/S2 no murmur, no rub, gallop or click PMI normal post sternotomy  Abdomen: benighn, BS positve, no tenderness, no AAA no bruit.  No HSM or HJR Distal pulses intact with no bruits No edema Neuro non-focal Skin warm and dry No muscular weakness   EKG:   05/26/18 SR rate 51 normal slightly low voltage 06/09/2021 SB rate 48 normal RAD   Recent Labs: 09/23/2020:  ALT 70    Lipid Panel    Component Value Date/Time   CHOL 148 09/23/2020 0818   CHOL 158 03/11/2017 0833   TRIG 84 09/23/2020 0818   HDL 53 09/23/2020 0818   HDL 54 03/11/2017 0833   CHOLHDL 2.8 09/23/2020 0818   VLDL 17 09/23/2020 0818   LDLCALC 78 09/23/2020 0818   LDLCALC 91 03/11/2017 0833      Wt Readings from Last 3 Encounters:  05/22/20 68 kg  06/19/19 68.9 kg  11/10/18 66.2 kg      Other studies Reviewed: Additional studies/ records that were reviewed today include: [facility] hospital CATH and CABG reports Echo and d/c summary see HPI.    ASSESSMENT AND PLAN:  1.  CAD:  01/2015 with IMI preserved EF  Rx BMS and staged CABG.   Continue ASA and beta blocker   New SL nitro called in 2.  Chol : elevated LFTls with lipitor Did not want to use zetia Started on Crestor December 2021 LDL 57 at goal on most recent labs  3.  Thyroid:  On replacement f/u labs with primary TSH 1.2    Current medicines are reviewed at length with the patient today.  The patient does not have concerns regarding medicines.  The following changes have been made:     Labs/ tests ordered today include:  None    No orders of the defined types were placed in this encounter.    Disposition:   FU with me in a year      Signed, Jenkins Rouge, MD  06/09/2021 8:55 AM    [facility] [place], [place], [place]  [address] Phone: [phone]; Fax: [phone]

## 2021-05-31 DIAGNOSIS — U071 COVID-19: Secondary | ICD-10-CM | POA: Diagnosis not present

## 2021-06-04 ENCOUNTER — Telehealth: Payer: Self-pay | Admitting: Pharmacist

## 2021-06-04 DIAGNOSIS — I214 Non-ST elevation (NSTEMI) myocardial infarction: Secondary | ICD-10-CM | POA: Diagnosis not present

## 2021-06-04 DIAGNOSIS — R7309 Other abnormal glucose: Secondary | ICD-10-CM | POA: Diagnosis not present

## 2021-06-04 DIAGNOSIS — Z1331 Encounter for screening for depression: Secondary | ICD-10-CM | POA: Diagnosis not present

## 2021-06-04 DIAGNOSIS — E782 Mixed hyperlipidemia: Secondary | ICD-10-CM | POA: Diagnosis not present

## 2021-06-04 DIAGNOSIS — I2510 Atherosclerotic heart disease of native coronary artery without angina pectoris: Secondary | ICD-10-CM | POA: Diagnosis not present

## 2021-06-04 DIAGNOSIS — Z1389 Encounter for screening for other disorder: Secondary | ICD-10-CM | POA: Diagnosis not present

## 2021-06-04 DIAGNOSIS — Z959 Presence of cardiac and vascular implant and graft, unspecified: Secondary | ICD-10-CM | POA: Diagnosis not present

## 2021-06-04 DIAGNOSIS — Z6826 Body mass index (BMI) 26.0-26.9, adult: Secondary | ICD-10-CM | POA: Diagnosis not present

## 2021-06-04 DIAGNOSIS — E039 Hypothyroidism, unspecified: Secondary | ICD-10-CM | POA: Diagnosis not present

## 2021-06-04 DIAGNOSIS — Z0001 Encounter for general adult medical examination with abnormal findings: Secondary | ICD-10-CM | POA: Diagnosis not present

## 2021-06-04 DIAGNOSIS — E663 Overweight: Secondary | ICD-10-CM | POA: Diagnosis not present

## 2021-06-04 NOTE — Telephone Encounter (Signed)
Called pt to see if she's had lipids/LFTs checked with PCP since changing from atorvastatin 40mg  to rosuvastatin 40mg  last December. She states she just had labs checked today. Will bring a copy of them to her appt with Dr Johnsie Cancel on July 5 as backup in case they are not faxed to our office before then. She is tolerating her rosuvastatin well and denies missed doses.

## 2021-06-09 ENCOUNTER — Other Ambulatory Visit: Payer: Self-pay

## 2021-06-09 ENCOUNTER — Ambulatory Visit (INDEPENDENT_AMBULATORY_CARE_PROVIDER_SITE_OTHER): Payer: Medicare Other | Admitting: Cardiovascular Disease

## 2021-06-09 VITALS — BP 126/72 | HR 48 | Ht 63.0 in | Wt 155.4 lb

## 2021-06-09 DIAGNOSIS — I2510 Atherosclerotic heart disease of native coronary artery without angina pectoris: Secondary | ICD-10-CM

## 2021-06-09 DIAGNOSIS — E782 Mixed hyperlipidemia: Secondary | ICD-10-CM | POA: Diagnosis not present

## 2021-06-09 DIAGNOSIS — E038 Other specified hypothyroidism: Secondary | ICD-10-CM | POA: Diagnosis not present

## 2021-06-09 DIAGNOSIS — Z951 Presence of aortocoronary bypass graft: Secondary | ICD-10-CM

## 2021-06-09 NOTE — Patient Instructions (Signed)
Medication Instructions:  Your physician recommends that you continue on your current medications as directed. Please refer to the Current Medication list given to you today.  *If you need a refill on your cardiac medications before your next appointment, please call your pharmacy*  Lab Work: If you have labs (blood work) drawn today and your tests are completely normal, you will receive your results only by: [place] (if you have MyChart) OR A paper copy in the mail If you have any lab test that is abnormal or we need to change your treatment, we will call you to review the results.  Testing/Procedures: None ordered today.  Follow-Up: At [facility], you and your health needs are our priority.  As part of our continuing mission to provide you with exceptional heart care, we have created designated Provider Care Teams.  These Care Teams include your primary Cardiologist (physician) and Advanced Practice Providers (APPs -  Physician Assistants and Nurse Practitioners) who all work together to provide you with the care you need, when you need it.  We recommend signing up for the patient portal called "MyChart".  Sign up information is provided on this After Visit Summary.  MyChart is used to connect with patients for Virtual Visits (Telemedicine).  Patients are able to view lab/test results, encounter notes, upcoming appointments, etc.  Non-urgent messages can be sent to your provider as well.   To learn more about what you can do with MyChart, go to [email or web address].    Your next appointment:   1 year(s)  The format for your next appointment:   In Person  Provider:   You may see Jenkins Rouge, MD or one of the following Advanced Practice Providers on your designated Care Team:   Kathyrn Drown, NP

## 2021-06-10 ENCOUNTER — Encounter: Payer: Self-pay | Admitting: Dermatology

## 2021-06-10 ENCOUNTER — Ambulatory Visit (INDEPENDENT_AMBULATORY_CARE_PROVIDER_SITE_OTHER): Payer: Medicare Other | Admitting: Dermatology

## 2021-06-10 DIAGNOSIS — D485 Neoplasm of uncertain behavior of skin: Secondary | ICD-10-CM

## 2021-06-10 DIAGNOSIS — L821 Other seborrheic keratosis: Secondary | ICD-10-CM | POA: Diagnosis not present

## 2021-06-10 DIAGNOSIS — I2510 Atherosclerotic heart disease of native coronary artery without angina pectoris: Secondary | ICD-10-CM | POA: Diagnosis not present

## 2021-06-10 NOTE — Patient Instructions (Signed)

## 2021-06-11 NOTE — Telephone Encounter (Signed)
Followed up with pt after recent office visit. She states she provided Dr Johnsie Cancel with a copy of her labs, they aren't available for viewing in Epic yet. LDL well controlled at 57 though which is excellent. Pt did report A1c increased from 5.7 to 6 and read that statins can increase glucose. She does report family hx of DM as well. Discussed that they can, but the cardiac benefit she's getting from statin therapy especially with her ASCVD history outweighs the slight increase in glucose that can be seen with statins. Encouraged pt to continue with daily exercise and watch carbs and sugar in her diet.

## 2021-06-18 DIAGNOSIS — E663 Overweight: Secondary | ICD-10-CM | POA: Diagnosis not present

## 2021-06-18 DIAGNOSIS — Z6827 Body mass index (BMI) 27.0-27.9, adult: Secondary | ICD-10-CM | POA: Diagnosis not present

## 2021-06-18 DIAGNOSIS — N342 Other urethritis: Secondary | ICD-10-CM | POA: Diagnosis not present

## 2021-06-18 DIAGNOSIS — R39198 Other difficulties with micturition: Secondary | ICD-10-CM | POA: Diagnosis not present

## 2021-06-25 ENCOUNTER — Encounter: Payer: Self-pay | Admitting: Dermatology

## 2021-06-25 NOTE — Progress Notes (Signed)
   Follow-Up Visit   Subjective  Paige [name] is a 70 y.o. female who presents for the following: Annual Exam (Left lower leg- brown spot- no other concerns).  Change in spot left shin plus check other lesions Location:  Duration:  Quality:  Associated Signs/Symptoms: Modifying Factors:  Severity:  Timing: Context:   Objective  Well appearing patient in no apparent distress; mood and affect are within normal limits. Left Lower Leg - Anterior Historical growth of pink-tan 9 mm waxy crust left shin       Left Thigh - Anterior Noninflamed textured brown seborrheic keratosis 5 mm    A focused examination was performed including head, neck, arms, back, legs.. Relevant physical exam findings are noted in the Assessment and Plan.   Assessment & Plan    Neoplasm of uncertain behavior of skin Left Lower Leg - Anterior  Skin / nail biopsy Type of biopsy: tangential   Informed consent: discussed and consent obtained   Timeout: patient name, date of birth, surgical site, and procedure verified   Procedure prep:  Patient was prepped and draped in usual sterile fashion (Non sterile) Prep type:  Chlorhexidine Anesthesia: the lesion was anesthetized in a standard fashion   Anesthetic:  1% lidocaine w/ epinephrine 1-100,000 local infiltration Instrument used: flexible razor blade   Hemostasis achieved with: ferric subsulfate   Outcome: patient tolerated procedure well   Post-procedure details: sterile dressing applied and wound care instructions given   Dressing type: pressure dressing and petrolatum    Specimen 1 - Surgical pathology Differential Diagnosis: R/O BCC vs SCC  Check Margins: No  Seborrheic keratosis Left Thigh - Anterior  No intervention necessary      I, Lavonna Monarch, MD, have reviewed all documentation for this visit.  The documentation on 06/25/21 for the exam, diagnosis, procedures, and orders are all accurate and complete.

## 2021-08-10 DIAGNOSIS — U071 COVID-19: Secondary | ICD-10-CM | POA: Diagnosis not present

## 2021-09-21 DIAGNOSIS — Z23 Encounter for immunization: Secondary | ICD-10-CM | POA: Diagnosis not present

## 2021-10-16 ENCOUNTER — Other Ambulatory Visit: Payer: Self-pay | Admitting: Cardiovascular Disease

## 2021-10-16 NOTE — Telephone Encounter (Signed)
This is a [place] pt.  °

## 2021-11-05 DIAGNOSIS — Z124 Encounter for screening for malignant neoplasm of cervix: Secondary | ICD-10-CM | POA: Diagnosis not present

## 2021-11-05 DIAGNOSIS — Z01419 Encounter for gynecological examination (general) (routine) without abnormal findings: Secondary | ICD-10-CM | POA: Diagnosis not present

## 2021-11-05 DIAGNOSIS — Z6827 Body mass index (BMI) 27.0-27.9, adult: Secondary | ICD-10-CM | POA: Diagnosis not present

## 2021-11-23 DIAGNOSIS — Z1231 Encounter for screening mammogram for malignant neoplasm of breast: Secondary | ICD-10-CM | POA: Diagnosis not present

## 2022-04-01 ENCOUNTER — Ambulatory Visit (INDEPENDENT_AMBULATORY_CARE_PROVIDER_SITE_OTHER): Payer: Medicare Other

## 2022-04-01 ENCOUNTER — Encounter: Payer: Self-pay | Admitting: Orthopedic Surgery

## 2022-04-01 ENCOUNTER — Ambulatory Visit (INDEPENDENT_AMBULATORY_CARE_PROVIDER_SITE_OTHER): Payer: Medicare Other | Admitting: Orthopedic Surgery

## 2022-04-01 VITALS — BP 124/66 | HR 61 | Ht 63.0 in | Wt 156.0 lb

## 2022-04-01 DIAGNOSIS — M545 Low back pain, unspecified: Secondary | ICD-10-CM | POA: Diagnosis not present

## 2022-04-01 DIAGNOSIS — M25552 Pain in left hip: Secondary | ICD-10-CM

## 2022-04-01 DIAGNOSIS — M4150 Other secondary scoliosis, site unspecified: Secondary | ICD-10-CM | POA: Diagnosis not present

## 2022-04-01 DIAGNOSIS — M5136 Other intervertebral disc degeneration, lumbar region: Secondary | ICD-10-CM | POA: Diagnosis not present

## 2022-04-01 NOTE — Progress Notes (Signed)
Chief Complaint  ?Patient presents with  ? Hip Pain  ?  Lt side hip and knee pain for 2 mos, getting worse but states it's a constant "toothache". Up and down stairs is worse, can only do one step at a time.   ? Knee Pain  ? ? ?HPI: Paige [name] is a 71 year old female had a heart attack at 28 recovered well walks 4 to 5 miles per day spends her winters in [place] comes in today with 72-monthhistory of left hip pain which starts in her buttock and radiates to her left knee.  She also has a history of osteoarthritis of the left knee and the knee is hurting more now that the hip is hurting.  She denies any lower back pain.  She took some Advil use some ice tried a massage and a TENS unit also felt good she did use some Advil but did not get satisfactory relief ? ?She does not complain of any groin pain ? ?Review of systems is positive for heartburn pollen allergy otherwise negative she has coronary artery disease as stated and she has hypothyroidism ? ? ? ?Past Medical History:  ?Diagnosis Date  ? Coronary artery disease   ? Hypertension   ? STEMI (ST elevation myocardial infarction) ([facility]   ? ? ?BP 124/66   Pulse 61   Ht '5\' 3"'$  (1.6 m)   Wt 156 lb (70.8 kg)   BMI 27.63 kg/m?  ? ? ?General appearance: Well-developed well-nourished no gross deformities ? ?Cardiovascular normal pulse and perfusion normal color without edema ? ?Neurologically no sensation loss or deficits or pathologic reflexes ? ?Psychological: Awake alert and oriented x3 mood and affect normal ? ?Skin no lacerations or ulcerations no nodularity no palpable masses, no erythema or nodularity ? ?Musculoskeletal: Exam of the lumbar spine shows some tightness when flexing but she can touch her toes she has no pain with extension if anything it feels better with extension. ? ?She has tenderness in the left buttock but no tenderness in the midline of the lumbar spine ? ?She has normal range of motion of both hips with no reproduction of pain and the greater  trochanters are nontender ? ?Imaging imaging of the pelvis showed normal right and left hip with no signs of arthritis ? ?The lumbar spine show degenerative scoliosis with especially notable narrowing at L3 and 4 ? ?The scoliosis is mild and is probably something its been there a long time ? ?A/P ? ?Encounter Diagnoses  ?Name Primary?  ? Degenerative scoliosis Yes  ? Pain in left hip   ? Degenerative disc disease, lumbar   ? ? ?71year old female new onset symptoms of leg pain no back pain normal hip exam and normal hip x-ray ? ?Recommend IM Depo-Medrol 40 mg ? ?Continue with heating pad, Advil, exercise, use a rolled towel or pillow when riding long distances ? ?No follow-up necessary  ? ?Procedure note ?IM injection left hip ?Medication 40 mg Depo-Medrol 2 cc 1% lidocaine ? ?Procedure done as follows.  Patient gave verbal consent timeout confirmed as site of injection as left gluteus maximus ? ?The skin was prepared with alcohol and ethyl chloride then a 20-gauge needle was used to inject 40 mg of Depo-Medrol in the musculature of the gluteus maximus ? ?This was tolerated well without complications ?

## 2022-04-22 DIAGNOSIS — N3021 Other chronic cystitis with hematuria: Secondary | ICD-10-CM | POA: Diagnosis not present

## 2022-05-12 ENCOUNTER — Ambulatory Visit (INDEPENDENT_AMBULATORY_CARE_PROVIDER_SITE_OTHER): Payer: Medicare Other | Admitting: Dermatology

## 2022-05-12 ENCOUNTER — Encounter: Payer: Self-pay | Admitting: Dermatology

## 2022-05-12 DIAGNOSIS — D2372 Other benign neoplasm of skin of left lower limb, including hip: Secondary | ICD-10-CM

## 2022-05-12 DIAGNOSIS — B078 Other viral warts: Secondary | ICD-10-CM

## 2022-05-12 DIAGNOSIS — Z1283 Encounter for screening for malignant neoplasm of skin: Secondary | ICD-10-CM

## 2022-05-12 DIAGNOSIS — L570 Actinic keratosis: Secondary | ICD-10-CM

## 2022-05-12 DIAGNOSIS — D485 Neoplasm of uncertain behavior of skin: Secondary | ICD-10-CM

## 2022-05-12 DIAGNOSIS — D1800 Hemangioma unspecified site: Secondary | ICD-10-CM

## 2022-05-12 DIAGNOSIS — L720 Epidermal cyst: Secondary | ICD-10-CM

## 2022-05-12 NOTE — Patient Instructions (Signed)

## 2022-05-20 ENCOUNTER — Telehealth: Payer: Self-pay | Admitting: Dermatology

## 2022-05-20 NOTE — Telephone Encounter (Signed)
Phone call to patient with her pathology results. Patient aware of results.  

## 2022-05-20 NOTE — Telephone Encounter (Signed)
Results

## 2022-05-31 ENCOUNTER — Encounter: Payer: Self-pay | Admitting: Dermatology

## 2022-06-01 DIAGNOSIS — H43393 Other vitreous opacities, bilateral: Secondary | ICD-10-CM | POA: Diagnosis not present

## 2022-06-06 NOTE — Progress Notes (Signed)
No Patient ID: Paige [name], female   DOB: Nov 08, 1951, 71 y.o.   MRN: [id number]     Cardiology Office Note   Date:  06/11/2022   ID:  Paige, [name] 12-May-1951, MRN [id number]  PCP:  Sharilyn Sites, MD  Cardiologist:   Jenkins Rouge, MD   No chief complaint on file.      History of Present Illness:  71 y.o. with IMI in [place] 01/23/15 Rx with acute BMS then had CABG with LIMA to LAD, SVG to RCA and SVG to O EF preserved  No preceding angina dyspnea  On day of MI did have nausea and diaphoresis with chest pain but never a problem before that day  Spend winter in [place]  Usually till April  Has two daughters and two grand daughters in [place] a lot and going to [facility]  No angina Walks 4 miles / day  recent left hip and knee pains seen by Dr Aline Brochure 04/01/22  with left hip injection xray ok lumbar spine with degenerative scoliosis and moderate disc dx  Husband also retired at home and doing well He works on her treadmill to keep it running well   Seen in lipid clinic December 2021 Lipitor changed to crestor Review labs from Dr Hilma Favors and TSH normal LDL 54   She is camping with travel trailer a lot and has a place in [place]  Past Medical History:  Diagnosis Date   Coronary artery disease    Hypertension    STEMI (ST elevation myocardial infarction) [facility])     Past Surgical History:  Procedure Laterality Date   CARDIAC CATHETERIZATION     COLONOSCOPY N/A 11/10/2018   Procedure: COLONOSCOPY;  Surgeon: Paige Binder, MD;  Location[facility];  Service: Endoscopy;  Laterality: N/A;  10:30   CORONARY ARTERY BYPASS GRAFT       Current Outpatient Medications  Medication Sig Dispense Refill   aspirin 81 MG tablet Take 81 mg by mouth at bedtime.      Cholecalciferol (VITAMIN D3) 5000 UNITS CAPS Take 5,000 Int'l Units by mouth daily.     ibuprofen (ADVIL,MOTRIN) 200 MG tablet Take 400 mg by mouth 2 (two) times daily as needed for  headache or moderate pain.     levothyroxine (SYNTHROID, LEVOTHROID) 75 MCG tablet Take 75 mcg by mouth daily before breakfast.     metoprolol succinate (TOPROL-XL) 25 MG 24 hr tablet TAKE 1 TABLET DAILY.       REPLACING TARTRATE         FORMULATION 90 tablet 3   Multiple Vitamin (MULTIVITAMIN) tablet Take 1 tablet by mouth daily.     nitroGLYCERIN (NITROSTAT) 0.4 MG SL tablet Place 1 tablet (0.4 mg total) under the tongue every 5 (five) minutes as needed for chest pain. 25 tablet 3   rosuvastatin (CRESTOR) 40 MG tablet TAKE 1 TABLET DAILY.       STOPPING ATORVASTATIN 90 tablet 3   zinc gluconate 50 MG tablet Take 50 mg by mouth daily.     Current Facility-Administered Medications  Medication Dose Route Frequency Provider Last Rate Last Admin   triamcinolone acetonide (KENALOG-40) injection 40 mg  40 mg Intramuscular Once Lavonna Monarch, MD        Allergies:   Patient has no known allergies.    Social History:  The patient  reports that she has never smoked. She has never used smokeless tobacco. She reports that she does not drink  alcohol and does not use drugs.   Family History:  The patient's family history includes Breast cancer in her mother; Heart disease in her father.    ROS:  Please see the history of present illness.   Otherwise, review of systems are positive for none.   All other systems are reviewed and negative.    PHYSICAL EXAM: VS:  BP 118/72   Pulse (!) 55   Ht '5\' 3"'$  (1.6 m)   Wt 158 lb (71.7 kg)   SpO2 97%   BMI 27.99 kg/m  , BMI Body mass index is 27.99 kg/m. Affect appropriate Healthy:  appears stated age 23: normal Neck supple with no adenopathy JVP normal no bruits no thyromegaly Lungs clear with no wheezing and good diaphragmatic motion Heart:  S1/S2 no murmur, no rub, gallop or click PMI normal post sternotomy  Abdomen: benighn, BS positve, no tenderness, no AAA no bruit.  No HSM or HJR Distal pulses intact with no bruits No edema Neuro  non-focal Skin warm and dry No muscular weakness   EKG:   06/11/2022 SR rate 55 normal   Recent Labs: No results found for requested labs within last 365 days.    Lipid Panel    Component Value Date/Time   CHOL 148 09/23/2020 0818   CHOL 158 03/11/2017 0833   TRIG 84 09/23/2020 0818   HDL 53 09/23/2020 0818   HDL 54 03/11/2017 0833   CHOLHDL 2.8 09/23/2020 0818   VLDL 17 09/23/2020 0818   LDLCALC 78 09/23/2020 0818   LDLCALC 91 03/11/2017 0833      Wt Readings from Last 3 Encounters:  06/11/22 158 lb (71.7 kg)  04/01/22 156 lb (70.8 kg)  06/09/21 155 lb 6.4 oz (70.5 kg)      Other studies Reviewed: Additional studies/ records that were reviewed today include: [facility] hospital CATH and CABG reports Echo and d/c summary see HPI.    ASSESSMENT AND PLAN:  1.  CAD:  01/2015 with IMI preserved EF  Rx BMS and staged CABG.   Continue ASA and beta blocker   New SL nitro called in 2.  Chol : elevated LFTls with lipitor Did not want to use zetia Started on Crestor December 2021 LDL 57 at goal on most recent labs with primary  3.  Thyroid:  On replacement f/u labs with primary TSH 1.2    Current medicines are reviewed at length with the patient today.  The patient does not have concerns regarding medicines.  The following changes have been made:     Labs/ tests ordered today include:  None    No orders of the defined types were placed in this encounter.     Disposition:   FU with me in a year      Signed, Jenkins Rouge, MD  06/11/2022 8:07 AM    [facility] [address], [place], [place]  [address] Phone: [phone]; Fax: [phone]

## 2022-06-07 DIAGNOSIS — E039 Hypothyroidism, unspecified: Secondary | ICD-10-CM | POA: Diagnosis not present

## 2022-06-07 DIAGNOSIS — R7309 Other abnormal glucose: Secondary | ICD-10-CM | POA: Diagnosis not present

## 2022-06-07 DIAGNOSIS — Z0001 Encounter for general adult medical examination with abnormal findings: Secondary | ICD-10-CM | POA: Diagnosis not present

## 2022-06-07 DIAGNOSIS — Z6827 Body mass index (BMI) 27.0-27.9, adult: Secondary | ICD-10-CM | POA: Diagnosis not present

## 2022-06-07 DIAGNOSIS — Z1331 Encounter for screening for depression: Secondary | ICD-10-CM | POA: Diagnosis not present

## 2022-06-07 DIAGNOSIS — D518 Other vitamin B12 deficiency anemias: Secondary | ICD-10-CM | POA: Diagnosis not present

## 2022-06-07 DIAGNOSIS — E559 Vitamin D deficiency, unspecified: Secondary | ICD-10-CM | POA: Diagnosis not present

## 2022-06-07 DIAGNOSIS — Z959 Presence of cardiac and vascular implant and graft, unspecified: Secondary | ICD-10-CM | POA: Diagnosis not present

## 2022-06-07 DIAGNOSIS — I214 Non-ST elevation (NSTEMI) myocardial infarction: Secondary | ICD-10-CM | POA: Diagnosis not present

## 2022-06-07 DIAGNOSIS — I2510 Atherosclerotic heart disease of native coronary artery without angina pectoris: Secondary | ICD-10-CM | POA: Diagnosis not present

## 2022-06-07 DIAGNOSIS — E663 Overweight: Secondary | ICD-10-CM | POA: Diagnosis not present

## 2022-06-07 DIAGNOSIS — E782 Mixed hyperlipidemia: Secondary | ICD-10-CM | POA: Diagnosis not present

## 2022-06-11 ENCOUNTER — Ambulatory Visit (INDEPENDENT_AMBULATORY_CARE_PROVIDER_SITE_OTHER): Payer: Medicare Other | Admitting: Cardiovascular Disease

## 2022-06-11 ENCOUNTER — Encounter: Payer: Self-pay | Admitting: Cardiovascular Disease

## 2022-06-11 VITALS — BP 118/72 | HR 55 | Ht 63.0 in | Wt 158.0 lb

## 2022-06-11 DIAGNOSIS — E7800 Pure hypercholesterolemia, unspecified: Secondary | ICD-10-CM

## 2022-06-11 DIAGNOSIS — I2510 Atherosclerotic heart disease of native coronary artery without angina pectoris: Secondary | ICD-10-CM

## 2022-06-11 DIAGNOSIS — Z951 Presence of aortocoronary bypass graft: Secondary | ICD-10-CM | POA: Diagnosis not present

## 2022-06-11 MED ORDER — NITROGLYCERIN 0.4 MG SL SUBL: 0.4000 mg | SUBLINGUAL_TABLET | SUBLINGUAL | 3 refills | Status: AC | PRN

## 2022-06-11 NOTE — Patient Instructions (Signed)
Medication Instructions:  Your physician recommends that you continue on your current medications as directed. Please refer to the Current Medication list given to you today.  *If you need a refill on your cardiac medications before your next appointment, please call your pharmacy*  Lab Work: If you have labs (blood work) drawn today and your tests are completely normal, you will receive your results only by: [place] (if you have MyChart) OR A paper copy in the mail If you have any lab test that is abnormal or we need to change your treatment, we will call you to review the results.   Follow-Up: At [facility], you and your health needs are our priority.  As part of our continuing mission to provide you with exceptional heart care, we have created designated Provider Care Teams.  These Care Teams include your primary Cardiologist (physician) and Advanced Practice Providers (APPs -  Physician Assistants and Nurse Practitioners) who all work together to provide you with the care you need, when you need it.  We recommend signing up for the patient portal called "MyChart".  Sign up information is provided on this After Visit Summary.  MyChart is used to connect with patients for Virtual Visits (Telemedicine).  Patients are able to view lab/test results, encounter notes, upcoming appointments, etc.  Non-urgent messages can be sent to your provider as well.   To learn more about what you can do with MyChart, go to [email or web address].    Your next appointment:   1 year(s)  The format for your next appointment:   In Person  Provider:   Jenkins Rouge, MD {  Important Information About Sugar

## 2022-07-08 ENCOUNTER — Telehealth: Payer: Self-pay | Admitting: Orthopedic Surgery

## 2022-07-08 NOTE — Telephone Encounter (Signed)
Per patient's request to have her visit records from April of 2023 faxed to her chiropractor, Dr Sandi Carne, called patient back following her signing authorization release form, to let her know we have completed the records request as per our release of information system.

## 2022-07-19 DIAGNOSIS — M47816 Spondylosis without myelopathy or radiculopathy, lumbar region: Secondary | ICD-10-CM | POA: Diagnosis not present

## 2022-07-19 DIAGNOSIS — M4126 Other idiopathic scoliosis, lumbar region: Secondary | ICD-10-CM | POA: Diagnosis not present

## 2022-07-19 DIAGNOSIS — M9903 Segmental and somatic dysfunction of lumbar region: Secondary | ICD-10-CM | POA: Diagnosis not present

## 2022-07-19 DIAGNOSIS — S73102A Unspecified sprain of left hip, initial encounter: Secondary | ICD-10-CM | POA: Diagnosis not present

## 2022-07-20 DIAGNOSIS — N3021 Other chronic cystitis with hematuria: Secondary | ICD-10-CM | POA: Diagnosis not present

## 2022-07-22 DIAGNOSIS — M4126 Other idiopathic scoliosis, lumbar region: Secondary | ICD-10-CM | POA: Diagnosis not present

## 2022-07-22 DIAGNOSIS — M47816 Spondylosis without myelopathy or radiculopathy, lumbar region: Secondary | ICD-10-CM | POA: Diagnosis not present

## 2022-07-22 DIAGNOSIS — M9903 Segmental and somatic dysfunction of lumbar region: Secondary | ICD-10-CM | POA: Diagnosis not present

## 2022-07-22 DIAGNOSIS — S73102A Unspecified sprain of left hip, initial encounter: Secondary | ICD-10-CM | POA: Diagnosis not present

## 2022-07-23 DIAGNOSIS — M4126 Other idiopathic scoliosis, lumbar region: Secondary | ICD-10-CM | POA: Diagnosis not present

## 2022-07-23 DIAGNOSIS — M9903 Segmental and somatic dysfunction of lumbar region: Secondary | ICD-10-CM | POA: Diagnosis not present

## 2022-07-23 DIAGNOSIS — S73102A Unspecified sprain of left hip, initial encounter: Secondary | ICD-10-CM | POA: Diagnosis not present

## 2022-07-23 DIAGNOSIS — M47816 Spondylosis without myelopathy or radiculopathy, lumbar region: Secondary | ICD-10-CM | POA: Diagnosis not present

## 2022-07-26 DIAGNOSIS — M4126 Other idiopathic scoliosis, lumbar region: Secondary | ICD-10-CM | POA: Diagnosis not present

## 2022-07-26 DIAGNOSIS — M47816 Spondylosis without myelopathy or radiculopathy, lumbar region: Secondary | ICD-10-CM | POA: Diagnosis not present

## 2022-07-26 DIAGNOSIS — M9903 Segmental and somatic dysfunction of lumbar region: Secondary | ICD-10-CM | POA: Diagnosis not present

## 2022-07-26 DIAGNOSIS — S73102A Unspecified sprain of left hip, initial encounter: Secondary | ICD-10-CM | POA: Diagnosis not present

## 2022-08-30 DIAGNOSIS — B078 Other viral warts: Secondary | ICD-10-CM | POA: Diagnosis not present

## 2022-08-30 DIAGNOSIS — Z789 Other specified health status: Secondary | ICD-10-CM | POA: Diagnosis not present

## 2022-08-30 DIAGNOSIS — R238 Other skin changes: Secondary | ICD-10-CM | POA: Diagnosis not present

## 2022-08-30 DIAGNOSIS — L538 Other specified erythematous conditions: Secondary | ICD-10-CM | POA: Diagnosis not present

## 2022-08-30 DIAGNOSIS — L570 Actinic keratosis: Secondary | ICD-10-CM | POA: Diagnosis not present

## 2022-09-14 DIAGNOSIS — M47816 Spondylosis without myelopathy or radiculopathy, lumbar region: Secondary | ICD-10-CM | POA: Diagnosis not present

## 2022-09-14 DIAGNOSIS — M79605 Pain in left leg: Secondary | ICD-10-CM | POA: Diagnosis not present

## 2022-09-14 DIAGNOSIS — Z6827 Body mass index (BMI) 27.0-27.9, adult: Secondary | ICD-10-CM | POA: Diagnosis not present

## 2022-09-14 DIAGNOSIS — M5136 Other intervertebral disc degeneration, lumbar region: Secondary | ICD-10-CM | POA: Diagnosis not present

## 2022-09-25 DIAGNOSIS — Z23 Encounter for immunization: Secondary | ICD-10-CM | POA: Diagnosis not present

## 2022-09-29 DIAGNOSIS — L538 Other specified erythematous conditions: Secondary | ICD-10-CM | POA: Diagnosis not present

## 2022-09-29 DIAGNOSIS — M5127 Other intervertebral disc displacement, lumbosacral region: Secondary | ICD-10-CM | POA: Diagnosis not present

## 2022-09-29 DIAGNOSIS — B078 Other viral warts: Secondary | ICD-10-CM | POA: Diagnosis not present

## 2022-09-29 DIAGNOSIS — L570 Actinic keratosis: Secondary | ICD-10-CM | POA: Diagnosis not present

## 2022-09-29 DIAGNOSIS — R238 Other skin changes: Secondary | ICD-10-CM | POA: Diagnosis not present

## 2022-09-29 DIAGNOSIS — R208 Other disturbances of skin sensation: Secondary | ICD-10-CM | POA: Diagnosis not present

## 2022-09-29 DIAGNOSIS — M48061 Spinal stenosis, lumbar region without neurogenic claudication: Secondary | ICD-10-CM | POA: Diagnosis not present

## 2022-09-29 DIAGNOSIS — M47816 Spondylosis without myelopathy or radiculopathy, lumbar region: Secondary | ICD-10-CM | POA: Diagnosis not present

## 2022-09-29 DIAGNOSIS — M47817 Spondylosis without myelopathy or radiculopathy, lumbosacral region: Secondary | ICD-10-CM | POA: Diagnosis not present

## 2022-09-29 DIAGNOSIS — Z789 Other specified health status: Secondary | ICD-10-CM | POA: Diagnosis not present

## 2022-09-29 DIAGNOSIS — L298 Other pruritus: Secondary | ICD-10-CM | POA: Diagnosis not present

## 2022-10-04 ENCOUNTER — Other Ambulatory Visit: Payer: Self-pay | Admitting: Cardiovascular Disease

## 2022-10-20 DIAGNOSIS — M5136 Other intervertebral disc degeneration, lumbar region: Secondary | ICD-10-CM | POA: Diagnosis not present

## 2022-10-20 DIAGNOSIS — M79605 Pain in left leg: Secondary | ICD-10-CM | POA: Diagnosis not present

## 2022-11-04 DIAGNOSIS — B078 Other viral warts: Secondary | ICD-10-CM | POA: Diagnosis not present

## 2022-11-04 DIAGNOSIS — B001 Herpesviral vesicular dermatitis: Secondary | ICD-10-CM | POA: Diagnosis not present

## 2022-11-04 DIAGNOSIS — L570 Actinic keratosis: Secondary | ICD-10-CM | POA: Diagnosis not present

## 2022-11-04 DIAGNOSIS — L578 Other skin changes due to chronic exposure to nonionizing radiation: Secondary | ICD-10-CM | POA: Diagnosis not present

## 2022-11-22 DIAGNOSIS — Z1231 Encounter for screening mammogram for malignant neoplasm of breast: Secondary | ICD-10-CM | POA: Diagnosis not present

## 2023-02-03 ENCOUNTER — Encounter: Payer: Self-pay | Admitting: Radiology

## 2023-04-11 DIAGNOSIS — N3021 Other chronic cystitis with hematuria: Secondary | ICD-10-CM | POA: Diagnosis not present

## 2023-05-11 DIAGNOSIS — D225 Melanocytic nevi of trunk: Secondary | ICD-10-CM | POA: Diagnosis not present

## 2023-05-11 DIAGNOSIS — L821 Other seborrheic keratosis: Secondary | ICD-10-CM | POA: Diagnosis not present

## 2023-05-11 DIAGNOSIS — L820 Inflamed seborrheic keratosis: Secondary | ICD-10-CM | POA: Diagnosis not present

## 2023-05-11 DIAGNOSIS — Z789 Other specified health status: Secondary | ICD-10-CM | POA: Diagnosis not present

## 2023-05-11 DIAGNOSIS — L298 Other pruritus: Secondary | ICD-10-CM | POA: Diagnosis not present

## 2023-05-11 DIAGNOSIS — L814 Other melanin hyperpigmentation: Secondary | ICD-10-CM | POA: Diagnosis not present

## 2023-05-11 DIAGNOSIS — L538 Other specified erythematous conditions: Secondary | ICD-10-CM | POA: Diagnosis not present

## 2023-06-01 NOTE — Progress Notes (Signed)
No Patient ID: Paige [name], female   DOB: 1951-11-25, 72 y.o.   MRN: [id number]     Cardiology Office Note   Date:  06/01/2023   ID:  Paige, [name] December 15, 1950, MRN [id number]  PCP:  Assunta Found, MD  Cardiologist:   Charlton Haws, MD   No chief complaint on file.      History of Present Illness:  72 y.o. with IMI in [place] 01/23/15 Rx with acute BMS then had CABG with LIMA to LAD, SVG to RCA and SVG to OM.  EF preserved  No preceding angina dyspnea  On day of MI did have nausea and diaphoresis with chest pain but never a problem before that day 72 y.o. Spend winter in [place]  Usually till April  Has two daughters and two grand daughters in [place] a lot and going to [facility]  No angina Walks 4 miles / day  Left hip and knee pains seen by Dr Romeo Apple 04/01/22  with left hip injection xray ok lumbar spine with degenerative scoliosis and moderate disc dx  Husband also retired at home and doing well He works on her treadmill to keep it running well   Seen in lipid clinic December 2021 Lipitor changed to crestor Review labs from Dr Phillips Odor and TSH normal LDL 72 LFTls normal   Camping a lot [address], [address]  [address]. Reviewed labs from primary good including TSH.   Past Medical History:  Diagnosis Date   Coronary artery disease    Hypertension    STEMI (ST elevation myocardial infarction) [facility])     Past Surgical History:  Procedure Laterality Date   CARDIAC CATHETERIZATION     COLONOSCOPY N/A 11/10/2018   Procedure: COLONOSCOPY;  Surgeon: [place] Bali, MD;  Location[facility];  Service: Endoscopy;  Laterality: N/A;  10:30   CORONARY ARTERY BYPASS GRAFT       Current Outpatient Medications  Medication Sig Dispense Refill   aspirin 81 MG tablet Take 81 mg by mouth at bedtime.      Cholecalciferol (VITAMIN D3) 5000 UNITS CAPS Take 5,000 Int'l Units by mouth daily.     ibuprofen (ADVIL,MOTRIN) 200 MG tablet Take 400 mg by mouth  2 (two) times daily as needed for headache or moderate pain.     levothyroxine (SYNTHROID, LEVOTHROID) 75 MCG tablet Take 75 mcg by mouth daily before breakfast.     metoprolol succinate (TOPROL-XL) 25 MG 24 hr tablet TAKE 1 TABLET DAILY.       REPLACING TARTRATE         FORMULATION 90 tablet 3   Multiple Vitamin (MULTIVITAMIN) tablet Take 1 tablet by mouth daily.     nitroGLYCERIN (NITROSTAT) 0.4 MG SL tablet Place 1 tablet (0.4 mg total) under the tongue every 5 (five) minutes as needed for chest pain. 25 tablet 3   rosuvastatin (CRESTOR) 40 MG tablet TAKE 1 TABLET DAILY.       STOPPING ATORVASTATIN 90 tablet 3   zinc gluconate 50 MG tablet Take 50 mg by mouth daily.     Current Facility-Administered Medications  Medication Dose Route Frequency Provider Last Rate Last Admin   triamcinolone acetonide (KENALOG-40) injection 40 mg  40 mg Intramuscular Once Janalyn Harder, MD        Allergies:   Patient has no known allergies.    Social History:  The patient  reports that she has never smoked. She has never used smokeless tobacco. She reports that  she does not drink alcohol and does not use drugs.   Family History:  The patient's family history includes Breast cancer in her mother; Heart disease in her father.    ROS:  Please see the history of present illness.   Otherwise, review of systems are positive for none.   All other systems are reviewed and negative.    PHYSICAL EXAM: VS:  There were no vitals taken for this visit. , BMI There is no height or weight on file to calculate BMI. Affect appropriate Healthy:  appears stated age HEENT: normal Neck supple with no adenopathy JVP normal no bruits no thyromegaly Lungs clear with no wheezing and good diaphragmatic motion Heart:  S1/S2 no murmur, no rub, gallop or click PMI normal post sternotomy  Abdomen: benighn, BS positve, no tenderness, no AAA no bruit.  No HSM or HJR Distal pulses intact with no bruits No edema Neuro  non-focal Skin warm and dry No muscular weakness   EKG: 06/14/2023 SR rate 49 normal   Recent Labs: No results found for requested labs within last 365 days.    Lipid Panel    Component Value Date/Time   CHOL 148 09/23/2020 0818   CHOL 158 03/11/2017 0833   TRIG 84 09/23/2020 0818   HDL 53 09/23/2020 0818   HDL 54 03/11/2017 0833   CHOLHDL 2.8 09/23/2020 0818   VLDL 17 09/23/2020 0818   LDLCALC 78 09/23/2020 0818   LDLCALC 91 03/11/2017 0833      Wt Readings from Last 3 Encounters:  06/11/22 158 lb (71.7 kg)  04/01/22 156 lb (70.8 kg)  06/09/21 155 lb 6.4 oz (70.5 kg)      Other studies Reviewed: Additional studies/ records that were reviewed today include: [facility] hospital CATH and CABG reports Echo and d/c summary see HPI.    ASSESSMENT AND PLAN:  1.  CAD:  01/2015 with IMI preserved EF  Rx BMS and staged CABG.   Continue ASA and beta blocker   New SL nitro called in 2.  Chol : elevated LFTls with lipitor Did not want to use zetia Started on Crestor December 2021 LDL 72  at goal on most recent labs with primary  3.  Thyroid:  On replacement f/u labs with primary TSH 1.2    Current medicines are reviewed at length with the patient today.  The patient does not have concerns regarding medicines.  The following changes have been made:     Labs/ tests ordered today include:  None    No orders of the defined types were placed in this encounter.     Disposition:   FU with me in a year      Signed, Charlton Haws, MD  06/01/2023 7:41 AM    [facility] [address] [place], [place], [place]  [address] Phone: [phone]; Fax: [phone]

## 2023-06-06 DIAGNOSIS — E782 Mixed hyperlipidemia: Secondary | ICD-10-CM | POA: Diagnosis not present

## 2023-06-06 DIAGNOSIS — I214 Non-ST elevation (NSTEMI) myocardial infarction: Secondary | ICD-10-CM | POA: Diagnosis not present

## 2023-06-06 DIAGNOSIS — Z0001 Encounter for general adult medical examination with abnormal findings: Secondary | ICD-10-CM | POA: Diagnosis not present

## 2023-06-06 DIAGNOSIS — R7309 Other abnormal glucose: Secondary | ICD-10-CM | POA: Diagnosis not present

## 2023-06-06 DIAGNOSIS — Z1331 Encounter for screening for depression: Secondary | ICD-10-CM | POA: Diagnosis not present

## 2023-06-06 DIAGNOSIS — E7849 Other hyperlipidemia: Secondary | ICD-10-CM | POA: Diagnosis not present

## 2023-06-06 DIAGNOSIS — E039 Hypothyroidism, unspecified: Secondary | ICD-10-CM | POA: Diagnosis not present

## 2023-06-06 DIAGNOSIS — D518 Other vitamin B12 deficiency anemias: Secondary | ICD-10-CM | POA: Diagnosis not present

## 2023-06-06 DIAGNOSIS — E559 Vitamin D deficiency, unspecified: Secondary | ICD-10-CM | POA: Diagnosis not present

## 2023-06-06 DIAGNOSIS — Z6827 Body mass index (BMI) 27.0-27.9, adult: Secondary | ICD-10-CM | POA: Diagnosis not present

## 2023-06-06 DIAGNOSIS — I2510 Atherosclerotic heart disease of native coronary artery without angina pectoris: Secondary | ICD-10-CM | POA: Diagnosis not present

## 2023-06-14 ENCOUNTER — Ambulatory Visit: Payer: Medicare Other | Attending: Cardiovascular Disease | Admitting: Cardiovascular Disease

## 2023-06-14 ENCOUNTER — Encounter: Payer: Self-pay | Admitting: Cardiovascular Disease

## 2023-06-14 VITALS — BP 126/70 | HR 49 | Ht 63.0 in | Wt 158.4 lb

## 2023-06-14 DIAGNOSIS — Z951 Presence of aortocoronary bypass graft: Secondary | ICD-10-CM | POA: Diagnosis not present

## 2023-06-14 DIAGNOSIS — E7800 Pure hypercholesterolemia, unspecified: Secondary | ICD-10-CM | POA: Insufficient documentation

## 2023-06-14 NOTE — Patient Instructions (Signed)
Medication Instructions:  Your physician recommends that you continue on your current medications as directed. Please refer to the Current Medication list given to you today.   Labwork: None today  Testing/Procedures: None today  Follow-Up: 1 year  Any Other Special Instructions Will Be Listed Below (If Applicable).  If you need a refill on your cardiac medications before your next appointment, please call your pharmacy.  

## 2023-09-21 DIAGNOSIS — Z23 Encounter for immunization: Secondary | ICD-10-CM | POA: Diagnosis not present

## 2023-09-26 ENCOUNTER — Other Ambulatory Visit: Payer: Self-pay | Admitting: Cardiovascular Disease

## 2023-11-07 ENCOUNTER — Encounter: Payer: Self-pay | Admitting: Adult Health

## 2023-11-07 ENCOUNTER — Other Ambulatory Visit (HOSPITAL_COMMUNITY)
Admission: RE | Admit: 2023-11-07 | Discharge: 2023-11-07 | Disposition: A | Discharge status: Home / Self Care | Payer: Medicare Other | Source: Ambulatory Visit | Attending: Adult Health | Admitting: Adult Health

## 2023-11-07 ENCOUNTER — Ambulatory Visit: Payer: Medicare Other | Admitting: Adult Health

## 2023-11-07 VITALS — BP 125/65 | HR 52 | Ht 63.0 in | Wt 159.0 lb

## 2023-11-07 DIAGNOSIS — Z01419 Encounter for gynecological examination (general) (routine) without abnormal findings: Secondary | ICD-10-CM | POA: Insufficient documentation

## 2023-11-07 DIAGNOSIS — Z1331 Encounter for screening for depression: Secondary | ICD-10-CM | POA: Diagnosis not present

## 2023-11-07 DIAGNOSIS — Z1211 Encounter for screening for malignant neoplasm of colon: Secondary | ICD-10-CM

## 2023-11-07 DIAGNOSIS — Z0000 Encounter for general adult medical examination without abnormal findings: Secondary | ICD-10-CM | POA: Diagnosis not present

## 2023-11-07 DIAGNOSIS — Z1151 Encounter for screening for human papillomavirus (HPV): Secondary | ICD-10-CM | POA: Diagnosis not present

## 2023-11-07 LAB — HEMOCCULT GUIAC POC 1CARD (OFFICE): Fecal Occult Blood, POC: NEGATIVE

## 2023-11-07 NOTE — Progress Notes (Signed)
Patient ID: Paige [name], female   DOB: [birth year], 72 y.o.   MRN: [id number] History of Present Illness: Paige [name] is a 72 year old white female,married, PM in for a well woman gyn exam and pap, at her request. She is a new pt. She is active, walking 3 miles every day. She had MI with 3 stents at age 30.  PCP is Dr Phillips Odor   Current Medications, Allergies, Past Medical History, Past Surgical History, Family History and Social History were reviewed in [employer] electronic medical record.     Review of Systems: Patient denies any headaches, hearing loss, fatigue, blurred vision, shortness of breath, chest pain, abdominal pain, problems with bowel movements, urination, or intercourse. No joint pain or mood swings.  Denies any vaginal bleeding    Physical Exam:BP 125/65 (BP Location: Left Arm, Patient Position: Sitting, Cuff Size: Normal)   Pulse (!) 52   Ht 5\' 3"  (1.6 m)   Wt 159 lb (72.1 kg)   BMI 28.17 kg/m   General:  Well developed, well nourished, no acute distress Skin:  Warm and dry Neck:  Midline trachea, normal thyroid, good ROM, no lymphadenopathy,no carotid bruits heard Lungs; Clear to auscultation bilaterally Breast:  No dominant palpable mass, retraction, or nipple discharge Cardiovascular: Regular rate and rhythm Abdomen:  Soft, non tender, no hepatosplenomegaly Pelvic:  External genitalia is normal in appearance, no lesions.  The vagina is pale with loss of rugae. Urethra has caruncle. The cervix is smooth and stenotic, pap with HR HPV genotyping performed.  Uterus is felt to be normal size, shape, and contour.  No adnexal masses or tenderness noted.Bladder is non tender, no masses felt. Rectal: Good sphincter tone, no polyps, or hemorrhoids felt.  Hemoccult negative. Extremities/musculoskeletal:  No swelling or varicosities noted, no clubbing or cyanosis Psych:  No mood changes, alert and cooperative,seems happy AA is 0 Fall risk is low    11/07/2023    8:37 AM   Depression screen PHQ 2/9  Decreased Interest 0  Down, Depressed, Hopeless 0  PHQ - 2 Score 0  Altered sleeping 0  Tired, decreased energy 0  Change in appetite 0  Feeling bad or failure about yourself  0  Trouble concentrating 0  Moving slowly or fidgety/restless 0  Suicidal thoughts 0  PHQ-9 Score 0       11/07/2023    8:37 AM  GAD 7 : Generalized Anxiety Score  Nervous, Anxious, on Edge 0  Control/stop worrying 0  Worry too much - different things 0  Trouble relaxing 0  Restless 0  Easily annoyed or irritable 0  Afraid - awful might happen 0  Total GAD 7 Score 0    Upstream - 11/07/23 0847       Pregnancy Intention Screening   Does the patient want to become pregnant in the next year? N/A    Does the patient's partner want to become pregnant in the next year? N/A    Would the patient like to discuss contraceptive options today? N/A      Contraception Wrap Up   Current Method Female Sterilization   tubal and postmenopausal   End Method Female Sterilization    Contraception Counseling Provided No              Examination chaperoned by Malachy Mood LPN  Impression and Plan: 1. Encounter for gynecological examination with Papanicolaou smear of cervix Pap sent If pap normal, no more paps needed Physical in 2 years Labs with PCP  M[facility]ogram 11/12/22 at [facility] was negative  - Cytology - PAP( [place])  2. Encounter for screening fecal occult blood testing Hemoccult was negative  - POCT occult blood stool

## 2023-11-09 LAB — CYTOLOGY - PAP
Comment: NEGATIVE
Diagnosis: NEGATIVE
High risk HPV: NEGATIVE

## 2023-11-25 DIAGNOSIS — Z1231 Encounter for screening mammogram for malignant neoplasm of breast: Secondary | ICD-10-CM | POA: Diagnosis not present

## 2023-12-02 DIAGNOSIS — E663 Overweight: Secondary | ICD-10-CM | POA: Diagnosis not present

## 2023-12-02 DIAGNOSIS — Z6828 Body mass index (BMI) 28.0-28.9, adult: Secondary | ICD-10-CM | POA: Diagnosis not present

## 2023-12-02 DIAGNOSIS — B001 Herpesviral vesicular dermatitis: Secondary | ICD-10-CM | POA: Diagnosis not present

## 2024-06-01 DIAGNOSIS — H8110 Benign paroxysmal vertigo, unspecified ear: Secondary | ICD-10-CM | POA: Diagnosis not present

## 2024-06-01 DIAGNOSIS — Z6827 Body mass index (BMI) 27.0-27.9, adult: Secondary | ICD-10-CM | POA: Diagnosis not present

## 2024-06-01 DIAGNOSIS — I2510 Atherosclerotic heart disease of native coronary artery without angina pectoris: Secondary | ICD-10-CM | POA: Diagnosis not present

## 2024-06-04 DIAGNOSIS — D225 Melanocytic nevi of trunk: Secondary | ICD-10-CM | POA: Diagnosis not present

## 2024-06-04 DIAGNOSIS — L814 Other melanin hyperpigmentation: Secondary | ICD-10-CM | POA: Diagnosis not present

## 2024-06-04 DIAGNOSIS — L821 Other seborrheic keratosis: Secondary | ICD-10-CM | POA: Diagnosis not present

## 2024-06-04 DIAGNOSIS — L905 Scar conditions and fibrosis of skin: Secondary | ICD-10-CM | POA: Diagnosis not present

## 2024-07-16 DIAGNOSIS — E782 Mixed hyperlipidemia: Secondary | ICD-10-CM | POA: Diagnosis not present

## 2024-07-16 DIAGNOSIS — I2510 Atherosclerotic heart disease of native coronary artery without angina pectoris: Secondary | ICD-10-CM | POA: Diagnosis not present

## 2024-07-16 DIAGNOSIS — E7849 Other hyperlipidemia: Secondary | ICD-10-CM | POA: Diagnosis not present

## 2024-07-16 DIAGNOSIS — D518 Other vitamin B12 deficiency anemias: Secondary | ICD-10-CM | POA: Diagnosis not present

## 2024-07-16 DIAGNOSIS — R7309 Other abnormal glucose: Secondary | ICD-10-CM | POA: Diagnosis not present

## 2024-07-16 DIAGNOSIS — E663 Overweight: Secondary | ICD-10-CM | POA: Diagnosis not present

## 2024-07-16 DIAGNOSIS — Z6827 Body mass index (BMI) 27.0-27.9, adult: Secondary | ICD-10-CM | POA: Diagnosis not present

## 2024-07-16 DIAGNOSIS — E039 Hypothyroidism, unspecified: Secondary | ICD-10-CM | POA: Diagnosis not present

## 2024-07-16 DIAGNOSIS — Z0001 Encounter for general adult medical examination with abnormal findings: Secondary | ICD-10-CM | POA: Diagnosis not present

## 2024-07-16 DIAGNOSIS — Z959 Presence of cardiac and vascular implant and graft, unspecified: Secondary | ICD-10-CM | POA: Diagnosis not present

## 2024-07-16 DIAGNOSIS — Z1331 Encounter for screening for depression: Secondary | ICD-10-CM | POA: Diagnosis not present

## 2024-07-16 DIAGNOSIS — E559 Vitamin D deficiency, unspecified: Secondary | ICD-10-CM | POA: Diagnosis not present

## 2024-07-23 NOTE — Progress Notes (Signed)
 No Patient ID: Paige [name], female   DOB: 09-29-1951, 73 y.o.   MRN: [id number]     Cardiology Office Note   Date:  07/30/2024   ID:  Paige [name], Paige [name] 1951/04/10, MRN [id number]  PCP:  Paige Rush, MD  Cardiologist:   Paige Emmer, MD   No chief complaint on file.      History of Present Illness:  73 y.o. with IMI in [place]  01/23/15 Rx with acute BMS then had CABG with LIMA to LAD, SVG to RCA and SVG to OM.  EF preserved  No preceding angina dyspnea  On day of MI did have nausea and diaphoresis with chest pain but never a problem before that day  Spend winter in [facility] [place]   Usually till April  Has two daughters and two grand daughters in [place] a lot and going to [name]  No angina Walks 4 miles / day  Left hip and knee pains seen by Paige [name] 04/01/22  with left hip injection xray ok lumbar spine with degenerative scoliosis and moderate disc dx  Husband also retired at home and doing well He works on her treadmill to keep it running well  Seen in lipid clinic December 2021 Lipitor changed to crestor  Review labs from Paige Paige and TSH normal LDL 51 LFTls normal  Triglycerides 71 has cut back on cool whip     Past Medical History:  Diagnosis Date   Coronary artery disease    Hypertension    STEMI (ST elevation myocardial infarction) [facility])     Past Surgical History:  Procedure Laterality Date   CARDIAC CATHETERIZATION     COLONOSCOPY N/A 11/10/2018   Procedure: COLONOSCOPY;  Surgeon: Paige Margo CROME, MD;  Location: [facility];  Service: Endoscopy;  Laterality: N/A;  10:30   CORONARY ARTERY BYPASS GRAFT       Current Outpatient Medications  Medication Sig Dispense Refill   aspirin 81 MG tablet Take 81 mg by mouth at bedtime.      Cholecalciferol (VITAMIN D3) 5000 UNITS CAPS Take 5,000 Int'l Units by mouth daily.     estradiol (ESTRACE) 0.1 MG/GM vaginal cream Place 1 Applicatorful vaginally at bedtime.     ibuprofen  (ADVIL,MOTRIN) 200 MG tablet Take 400 mg by mouth 2 (two) times daily as needed for headache or moderate pain.     levothyroxine (SYNTHROID, LEVOTHROID) 75 MCG tablet Take 75 mcg by mouth daily before breakfast.     meclizine (ANTIVERT) 25 MG tablet Take 12.5-25 mg by mouth 3 (three) times daily as needed.     metoprolol  succinate (TOPROL -XL) 25 MG 24 hr tablet TAKE 1 TABLET DAILY.       REPLACING TARTRATE         FORMULATION (Patient taking differently: Take 12.5 mg by mouth daily.) 90 tablet 3   Multiple Vitamin (MULTIVITAMIN) tablet Take 1 tablet by mouth daily.     nitroGLYCERIN  (NITROSTAT ) 0.4 MG SL tablet Place 1 tablet (0.4 mg total) under the tongue every 5 (five) minutes as needed for chest pain. 25 tablet 3   rosuvastatin  (CRESTOR ) 40 MG tablet TAKE 1 TABLET DAILY.       STOPPING ATORVASTATIN  90 tablet 3   valACYclovir (VALTREX) 1000 MG tablet Take 1,000 mg by mouth as needed.     Current Facility-Administered Medications  Medication Dose Route Frequency Provider Last Rate Last Admin   triamcinolone  acetonide (KENALOG -40) injection 40 mg  40 mg Intramuscular Once Paige [name], Stuart, MD  Allergies:   Patient has no known allergies.    Social History:  The patient  reports that she has never smoked. She has never used smokeless tobacco. She reports that she does not drink alcohol and does not use drugs.   Family History:  The patient's family history includes Breast cancer in her mother; Heart disease in her father.    ROS:  Please see the history of present illness.   Otherwise, review of systems are positive for none.   All other systems are reviewed and negative.    PHYSICAL EXAM: VS:  BP 110/62 (BP Location: Left Arm, Patient Position: Sitting, Cuff Size: Normal)   Pulse (!) 47   Ht 5' 3 (1.6 m)   Wt 155 lb (70.3 kg)   SpO2 98%   BMI 27.46 kg/m  , BMI Body mass index is 27.46 kg/m. Affect appropriate Healthy:  appears stated age HEENT: normal Neck supple with no  adenopathy JVP normal no bruits no thyromegaly Lungs clear with no wheezing and good diaphragmatic motion Heart:  S1/S2 no murmur, no rub, gallop or click PMI normal post sternotomy  Abdomen: benighn, BS positve, no tenderness, no AAA no bruit.  No HSM or HJR Distal pulses intact with no bruits No edema Neuro non-focal Skin warm and dry No muscular weakness   EKG: 07/30/2024 SR rate 49 normal   Recent Labs: No results found for requested labs within last 365 days.    Lipid Panel    Component Value Date/Time   CHOL 148 09/23/2020 0818   CHOL 158 03/11/2017 0833   TRIG 84 09/23/2020 0818   HDL 53 09/23/2020 0818   HDL 54 03/11/2017 0833   CHOLHDL 2.8 09/23/2020 0818   VLDL 17 09/23/2020 0818   LDLCALC 78 09/23/2020 0818   LDLCALC 91 03/11/2017 0833      Wt Readings from Last 3 Encounters:  07/30/24 155 lb (70.3 kg)  11/07/23 159 lb (72.1 kg)  06/14/23 158 lb 6.4 oz (71.8 kg)      Other studies Reviewed: Additional studies/ records that were reviewed today include: [facility] hospital CATH and CABG reports Echo and d/c summary see HPI.    ASSESSMENT AND PLAN:  1.  CAD:  01/2015 with IMI preserved EF  Rx BMS and staged CABG.   Continue ASA and beta blocker   New SL nitro called in Very active with no angina  D/c beta blocker due to low BP and bradycardia 2.  Chol : elevated LFTls with lipitor Did not want to use zetia  Started on Crestor  December 2021 LDL 72  at goal on most recent labs with primary  3.  Thyroid :  On replacement f/u labs with primary TSH 1.2    Current medicines are reviewed at length with the patient today.  The patient does not have concerns regarding medicines.  The following changes have been made:   D/C lopressor    Labs/ tests ordered today include:  None    Orders Placed This Encounter  Procedures   EKG 12-Lead      Disposition:   FU with me in a year      Signed, Paige Emmer, MD  07/30/2024 9:51 AM    [facility]  [facility] [address] [place], [place], [place]  [address] Phone: [phone]; Fax: [phone]

## 2024-07-30 ENCOUNTER — Ambulatory Visit: Attending: Cardiovascular Disease | Admitting: Cardiovascular Disease

## 2024-07-30 ENCOUNTER — Encounter: Payer: Self-pay | Admitting: Cardiovascular Disease

## 2024-07-30 VITALS — BP 110/62 | HR 47 | Ht 63.0 in | Wt 155.0 lb

## 2024-07-30 DIAGNOSIS — Z951 Presence of aortocoronary bypass graft: Secondary | ICD-10-CM | POA: Insufficient documentation

## 2024-07-30 DIAGNOSIS — E7800 Pure hypercholesterolemia, unspecified: Secondary | ICD-10-CM | POA: Insufficient documentation

## 2024-07-30 NOTE — Patient Instructions (Addendum)
 Medication Instructions:  Stop: Metoprolol  Succinate (Toprol  XL)  *If you need a refill on your cardiac medications before your next appointment, please call your pharmacy*  Lab Work: None  Follow-Up: At [facility], you and your health needs are our priority.  As part of our continuing mission to provide you with exceptional heart care, our providers are all part of one team.  This team includes your primary Cardiologist (physician) and Advanced Practice Providers or APPs (Physician Assistants and Nurse Practitioners) who all work together to provide you with the care you need, when you need it.  Your next appointment:   1 year(s)  Provider:   Maude Emmer, MD     Other Instructions Please call us  or send a MyChart message with any Cardiology related questions/concerns.  [phone].  Thank you!

## 2024-07-30 NOTE — Addendum Note (Signed)
 Addended by: Somya Jauregui C on: 07/30/2024 10:07 AM   Modules accepted: Orders

## 2024-09-12 DIAGNOSIS — Z23 Encounter for immunization: Secondary | ICD-10-CM | POA: Diagnosis not present

## 2024-09-18 ENCOUNTER — Other Ambulatory Visit: Payer: Self-pay | Admitting: Cardiovascular Disease

## 2024-09-24 DIAGNOSIS — L244 Irritant contact dermatitis due to drugs in contact with skin: Secondary | ICD-10-CM | POA: Diagnosis not present
